# Patient Record
Sex: Female | Born: 1950 | Race: White | Hispanic: No | Smoking: Never smoker
Health system: Southern US, Community
[De-identification: ages and names within clinical notes are randomized; demographics above are authoritative.]

## PROBLEM LIST (undated history)

## (undated) DIAGNOSIS — E785 Hyperlipidemia, unspecified: Secondary | ICD-10-CM

## (undated) DIAGNOSIS — K219 Gastro-esophageal reflux disease without esophagitis: Secondary | ICD-10-CM

## (undated) DIAGNOSIS — I10 Essential (primary) hypertension: Secondary | ICD-10-CM

## (undated) DIAGNOSIS — F329 Major depressive disorder, single episode, unspecified: Secondary | ICD-10-CM

## (undated) DIAGNOSIS — F32A Depression, unspecified: Secondary | ICD-10-CM

## (undated) DIAGNOSIS — F419 Anxiety disorder, unspecified: Secondary | ICD-10-CM

## (undated) DIAGNOSIS — M797 Fibromyalgia: Secondary | ICD-10-CM

## (undated) DIAGNOSIS — G4733 Obstructive sleep apnea (adult) (pediatric): Secondary | ICD-10-CM

## (undated) HISTORY — PX: TRIGGER FINGER RELEASE: SHX641

## (undated) HISTORY — DX: Essential (primary) hypertension: I10

## (undated) HISTORY — PX: FRACTURE SURGERY: SHX138

## (undated) HISTORY — PX: REDUCTION MAMMAPLASTY: SUR839

## (undated) HISTORY — PX: CARPAL TUNNEL RELEASE: SHX101

## (undated) HISTORY — DX: Obstructive sleep apnea (adult) (pediatric): G47.33

## (undated) HISTORY — DX: Hyperlipidemia, unspecified: E78.5

## (undated) HISTORY — PX: ABDOMINAL HYSTERECTOMY: SHX81

---

## 1998-10-23 ENCOUNTER — Other Ambulatory Visit: Admission: RE | Admit: 1998-10-23 | Discharge: 1998-10-23 | Payer: Self-pay | Admitting: Obstetrics and Gynecology

## 1999-09-25 ENCOUNTER — Ambulatory Visit (HOSPITAL_BASED_OUTPATIENT_CLINIC_OR_DEPARTMENT_OTHER): Admission: RE | Admit: 1999-09-25 | Discharge: 1999-09-25 | Payer: Self-pay | Admitting: Orthopedic Surgery

## 1999-11-05 ENCOUNTER — Other Ambulatory Visit: Admission: RE | Admit: 1999-11-05 | Discharge: 1999-11-05 | Payer: Self-pay | Admitting: Obstetrics and Gynecology

## 1999-12-02 ENCOUNTER — Encounter: Payer: Self-pay | Admitting: Obstetrics and Gynecology

## 1999-12-02 ENCOUNTER — Encounter: Admission: RE | Admit: 1999-12-02 | Discharge: 1999-12-02 | Payer: Self-pay | Admitting: Obstetrics and Gynecology

## 2000-12-14 ENCOUNTER — Encounter: Payer: Self-pay | Admitting: Obstetrics and Gynecology

## 2000-12-14 ENCOUNTER — Encounter: Admission: RE | Admit: 2000-12-14 | Discharge: 2000-12-14 | Payer: Self-pay | Admitting: Obstetrics and Gynecology

## 2000-12-14 ENCOUNTER — Other Ambulatory Visit: Admission: RE | Admit: 2000-12-14 | Discharge: 2000-12-14 | Payer: Self-pay | Admitting: Obstetrics and Gynecology

## 2001-12-20 ENCOUNTER — Encounter: Admission: RE | Admit: 2001-12-20 | Discharge: 2001-12-20 | Payer: Self-pay | Admitting: Obstetrics and Gynecology

## 2001-12-20 ENCOUNTER — Encounter: Payer: Self-pay | Admitting: Obstetrics and Gynecology

## 2001-12-28 ENCOUNTER — Other Ambulatory Visit: Admission: RE | Admit: 2001-12-28 | Discharge: 2001-12-28 | Payer: Self-pay | Admitting: Obstetrics and Gynecology

## 2003-01-03 ENCOUNTER — Encounter: Admission: RE | Admit: 2003-01-03 | Discharge: 2003-01-03 | Payer: Self-pay | Admitting: Obstetrics and Gynecology

## 2003-01-03 ENCOUNTER — Encounter: Payer: Self-pay | Admitting: Obstetrics and Gynecology

## 2003-01-10 ENCOUNTER — Encounter: Admission: RE | Admit: 2003-01-10 | Discharge: 2003-01-10 | Payer: Self-pay | Admitting: Obstetrics and Gynecology

## 2003-01-10 ENCOUNTER — Encounter: Payer: Self-pay | Admitting: Obstetrics and Gynecology

## 2003-01-24 ENCOUNTER — Other Ambulatory Visit: Admission: RE | Admit: 2003-01-24 | Discharge: 2003-01-24 | Payer: Self-pay | Admitting: Obstetrics and Gynecology

## 2003-02-28 ENCOUNTER — Encounter: Admission: RE | Admit: 2003-02-28 | Discharge: 2003-02-28 | Payer: Self-pay

## 2004-02-05 ENCOUNTER — Encounter: Admission: RE | Admit: 2004-02-05 | Discharge: 2004-02-05 | Payer: Self-pay | Admitting: Obstetrics and Gynecology

## 2004-02-26 ENCOUNTER — Other Ambulatory Visit: Admission: RE | Admit: 2004-02-26 | Discharge: 2004-02-26 | Payer: Self-pay | Admitting: Obstetrics and Gynecology

## 2005-02-11 ENCOUNTER — Encounter: Admission: RE | Admit: 2005-02-11 | Discharge: 2005-02-11 | Payer: Self-pay | Admitting: Obstetrics and Gynecology

## 2006-03-17 ENCOUNTER — Encounter: Admission: RE | Admit: 2006-03-17 | Discharge: 2006-03-17 | Payer: Self-pay | Admitting: Obstetrics and Gynecology

## 2007-06-22 ENCOUNTER — Encounter: Admission: RE | Admit: 2007-06-22 | Discharge: 2007-06-22 | Payer: Self-pay | Admitting: Obstetrics and Gynecology

## 2008-11-10 ENCOUNTER — Encounter: Admission: RE | Admit: 2008-11-10 | Discharge: 2008-11-10 | Payer: Self-pay | Admitting: Obstetrics and Gynecology

## 2009-04-16 ENCOUNTER — Encounter: Admission: RE | Admit: 2009-04-16 | Discharge: 2009-04-16 | Payer: Self-pay | Admitting: Obstetrics and Gynecology

## 2009-10-10 ENCOUNTER — Ambulatory Visit (HOSPITAL_BASED_OUTPATIENT_CLINIC_OR_DEPARTMENT_OTHER): Admission: RE | Admit: 2009-10-10 | Discharge: 2009-10-10 | Payer: Self-pay | Admitting: Orthopedic Surgery

## 2009-10-10 ENCOUNTER — Encounter (INDEPENDENT_AMBULATORY_CARE_PROVIDER_SITE_OTHER): Payer: Self-pay | Admitting: Orthopedic Surgery

## 2010-07-16 ENCOUNTER — Ambulatory Visit (HOSPITAL_BASED_OUTPATIENT_CLINIC_OR_DEPARTMENT_OTHER): Admission: RE | Admit: 2010-07-16 | Discharge: 2010-07-16 | Payer: Self-pay | Admitting: Orthopedic Surgery

## 2011-02-28 LAB — POCT I-STAT, CHEM 8
BUN: 13 mg/dL (ref 6–23)
Calcium, Ion: 1.17 mmol/L (ref 1.12–1.32)
Chloride: 103 mEq/L (ref 96–112)
Creatinine, Ser: 0.9 mg/dL (ref 0.4–1.2)
Glucose, Bld: 111 mg/dL — ABNORMAL HIGH (ref 70–99)
HCT: 46 % (ref 36.0–46.0)
TCO2: 29 mmol/L (ref 0–100)

## 2011-03-20 LAB — BASIC METABOLIC PANEL
Calcium: 9.6 mg/dL (ref 8.4–10.5)
Chloride: 103 mEq/L (ref 96–112)
Creatinine, Ser: 0.78 mg/dL (ref 0.4–1.2)
GFR calc Af Amer: >60 mL/min (ref >60)
Potassium: 4 mEq/L (ref 3.5–5.1)
Sodium: 140 mEq/L (ref 135–145)

## 2011-09-24 ENCOUNTER — Encounter (HOSPITAL_COMMUNITY)
Admission: RE | Admit: 2011-09-24 | Discharge: 2011-09-24 | Disposition: A | Discharge status: Home / Self Care | Payer: 59 | Source: Ambulatory Visit | Attending: Orthopaedic Surgery | Admitting: Orthopaedic Surgery

## 2011-09-24 ENCOUNTER — Other Ambulatory Visit (HOSPITAL_COMMUNITY): Payer: Self-pay | Admitting: Orthopaedic Surgery

## 2011-09-24 DIAGNOSIS — M4802 Spinal stenosis, cervical region: Secondary | ICD-10-CM

## 2011-09-24 DIAGNOSIS — M4312 Spondylolisthesis, cervical region: Secondary | ICD-10-CM

## 2011-09-24 LAB — COMPREHENSIVE METABOLIC PANEL
Albumin: 3.8 g/dL (ref 3.5–5.2)
CO2: 30 mEq/L (ref 19–32)
Chloride: 104 mEq/L (ref 96–112)
Creatinine, Ser: 0.71 mg/dL (ref 0.50–1.10)
GFR calc non Af Amer: >90 mL/min (ref >90)
Total Bilirubin: 0.4 mg/dL (ref 0.3–1.2)
Total Protein: 6.6 g/dL (ref 6.0–8.3)

## 2011-09-24 LAB — CBC
HCT: 39.4 % (ref 36.0–46.0)
MCHC: 34.8 g/dL (ref 30.0–36.0)
RBC: 4.22 MIL/uL (ref 3.87–5.11)

## 2011-09-24 LAB — URINALYSIS, ROUTINE W REFLEX MICROSCOPIC
Glucose, UA: NEGATIVE mg/dL
Nitrite: NEGATIVE
Protein, ur: NEGATIVE mg/dL
Specific Gravity, Urine: 1.017 (ref 1.005–1.030)
Urobilinogen, UA: 0.2 mg/dL (ref 0.0–1.0)
pH: 5.5 (ref 5.0–8.0)

## 2011-09-24 LAB — DIFFERENTIAL
Basophils Relative: 1 % (ref 0–1)
Eosinophils Relative: 4 % (ref 0–5)
Lymphs Abs: 1.7 10*3/uL (ref 0.7–4.0)

## 2011-09-24 LAB — SURGICAL PCR SCREEN: Staphylococcus aureus: NEGATIVE

## 2011-09-24 LAB — PROTIME-INR: Prothrombin Time: 12.2 seconds (ref 11.6–15.2)

## 2011-10-03 ENCOUNTER — Inpatient Hospital Stay (HOSPITAL_COMMUNITY)
Admission: RE | Admit: 2011-10-03 | Discharge: 2011-10-04 | DRG: 473 | Disposition: A | Discharge status: Home / Self Care | Payer: 59 | Source: Ambulatory Visit | Attending: Orthopaedic Surgery | Admitting: Orthopaedic Surgery

## 2011-10-03 ENCOUNTER — Other Ambulatory Visit (HOSPITAL_COMMUNITY): Payer: Self-pay | Admitting: Orthopaedic Surgery

## 2011-10-03 ENCOUNTER — Inpatient Hospital Stay (HOSPITAL_COMMUNITY): Payer: 59

## 2011-10-03 ENCOUNTER — Ambulatory Visit (HOSPITAL_COMMUNITY)
Admission: RE | Admit: 2011-10-03 | Discharge: 2011-10-03 | Disposition: A | Discharge status: Home / Self Care | Payer: 59 | Source: Ambulatory Visit | Attending: Orthopaedic Surgery | Admitting: Orthopaedic Surgery

## 2011-10-03 DIAGNOSIS — M4322 Fusion of spine, cervical region: Secondary | ICD-10-CM

## 2011-10-03 DIAGNOSIS — K219 Gastro-esophageal reflux disease without esophagitis: Secondary | ICD-10-CM | POA: Diagnosis present

## 2011-10-03 DIAGNOSIS — H269 Unspecified cataract: Secondary | ICD-10-CM | POA: Diagnosis present

## 2011-10-03 DIAGNOSIS — M47812 Spondylosis without myelopathy or radiculopathy, cervical region: Principal | ICD-10-CM | POA: Diagnosis present

## 2011-10-03 DIAGNOSIS — I10 Essential (primary) hypertension: Secondary | ICD-10-CM | POA: Diagnosis present

## 2011-10-03 DIAGNOSIS — Z01818 Encounter for other preprocedural examination: Secondary | ICD-10-CM

## 2011-10-03 DIAGNOSIS — G4730 Sleep apnea, unspecified: Secondary | ICD-10-CM | POA: Diagnosis present

## 2011-10-03 DIAGNOSIS — Z23 Encounter for immunization: Secondary | ICD-10-CM

## 2011-10-03 DIAGNOSIS — Z01810 Encounter for preprocedural cardiovascular examination: Secondary | ICD-10-CM

## 2011-10-03 DIAGNOSIS — F3289 Other specified depressive episodes: Secondary | ICD-10-CM | POA: Diagnosis present

## 2011-10-03 DIAGNOSIS — Z01812 Encounter for preprocedural laboratory examination: Secondary | ICD-10-CM

## 2011-10-03 DIAGNOSIS — Z880 Allergy status to penicillin: Secondary | ICD-10-CM

## 2011-10-03 DIAGNOSIS — Q762 Congenital spondylolisthesis: Secondary | ICD-10-CM

## 2011-10-03 DIAGNOSIS — F329 Major depressive disorder, single episode, unspecified: Secondary | ICD-10-CM | POA: Diagnosis present

## 2011-10-03 DIAGNOSIS — M129 Arthropathy, unspecified: Secondary | ICD-10-CM | POA: Diagnosis present

## 2011-10-03 DIAGNOSIS — F411 Generalized anxiety disorder: Secondary | ICD-10-CM | POA: Diagnosis present

## 2011-10-11 NOTE — Op Note (Signed)
NAMEELLERY, TASH NO.:  [id number]  MEDICAL RECORD NO.:  [id number]  LOCATION:  5018                         FACILITY:  [facility]  PHYSICIAN:  Amer Alcindor C. Ophelia Charter, M.D.    DATE OF BIRTH:  1951-04-09  DATE OF PROCEDURE:  10/03/2011 DATE OF DISCHARGE:                              OPERATIVE REPORT   PREOPERATIVE DIAGNOSIS:  Cervical spondylosis C4-5, C5-6.  POSTOPERATIVE DIAGNOSIS:  Cervical spondylosis C4-5, C5-6.  PROCEDURE:  C4-5, C5-6 anterior cervical diskectomy and fusion, allograft and plate.  SURGEON:  Sacheen Arrasmith C. Ophelia Charter, MD  ASSISTANT:  Wende Neighbors, PA-C, medically necessary and present for the entire procedure.  ESTIMATED BLOOD LOSS:  Minimal.  DRAINS:  1 Hemovac neck.  After induction of general anesthesia orotracheal intubation with head halter traction application with 2-pound weight, a horseshoe head holder had been wrapped with Webril.  Arms were tucked at the side with padding over the ulnar nerve.  DuraPrep was used.  Preoperative Ancef was given. Time-out procedure was completed.  Sterile skin marker was used on the neck.  Area was cleared with towels, Betadine, Steri-Drape in sterile Mayo stand at the head and thyroid sheets and drapes.  Incision was made starting at the midline extending to the left midway between the C4-5, C5-6 level based on palpable landmarks.  Platysma was thick and was divided in line with skin incision, blunt dissection down above the omohyoid was performed down to the longus colli muscle.  Spurs were palpated corresponding with x-rays and MRI scan which were displayed in the room.  Short 25 needle was placed in the C5-6 disk space confirmed with lateral C-arm spot photo.  It was marked after moving the knee with direct visualization cutting ulna disk removing some with pituitary and then self-retaining retractors were placed right and left, smooth blades up and down.  Using the operative microscope, bur was used to  remove the spurs.  Endplates were hand curetted, progressing gradually back to the posterior longitudinal ligament.  Uncovertebral spurs were removed. Chunks of ligament were removed.  Once there was complete take down of posterior and longitudinal ligament, dura was well visualized and overhanging spurs were removed that were so causing the moderate stenosis.  Right and left uncovertebral joints were stripped and foramina was enlarged using the 2-mm Kerrison in each gutter right and left.  Endplate was rasped, size 7-mm graft was placed, countersunk slightly, there were considerably anterior spurs.  These were trimmed back and on the midportion of the C6 vertebrae bur had to be used to flatten the bone slightly so that the plate would be able to sit flat.  At the end of procedure repeated at L4-5.  There was a little bit of epidural bleeding on the left L4-5, some thrombin and Gelfoam was placed.  Dura was decompressed.  Spurs were removed.  Trial sizers showed a 7 mm, gave a good fit.  Graft was placed.  Anterior spurs removed.  A 30-mm VueLock Biomet EBI plate was selected, inserted and 14 mm screws were placed on the top and the bottom of the plate.  In the middle of the plate in the C4 level, a 16-mm screw  was placed on the right side.  On the left the screw wanted to angle down was left out and was attempted to be redirected but wanted to angle wrong position. Graft was extremely tight and the 16 was selected since with removal of the spurs.  At the level of the vertebral body there was 1 or 2 mm gap and the plate was not flushed with anterior cortex.  AP and lateral x- rays were taken which showed good position alignment of the graft and screws and after irrigation, Hemovac was placed through separate stab incision in and out technique.  Platysma was closed with 3-0, 4-0 Vicryl subcuticular closure, tincture of benzoin and Steri-Strips, Marcaine infiltration, postop dressing and  soft cervical collar.  The patient tolerated the procedure well in stable condition.     Mikaelyn Arthurs C. Ophelia Charter, M.D.     MCY/MEDQ  D:  10/03/2011  T:  10/04/2011  Job:  [id number]  Electronically Signed by Annell Greening M.D. on 10/11/2011 12:27:26 PM

## 2012-10-12 IMAGING — CR DG CERVICAL SPINE 1V
1 series · 1 of 1 positions shown · non-contrast
Comparison: None.

CLINICAL DATA: Preoperative examination (C4 - C5, C5 - C6 ACDF)

DG SPINE PORTABLE - 1 VIEW

[view not recorded]
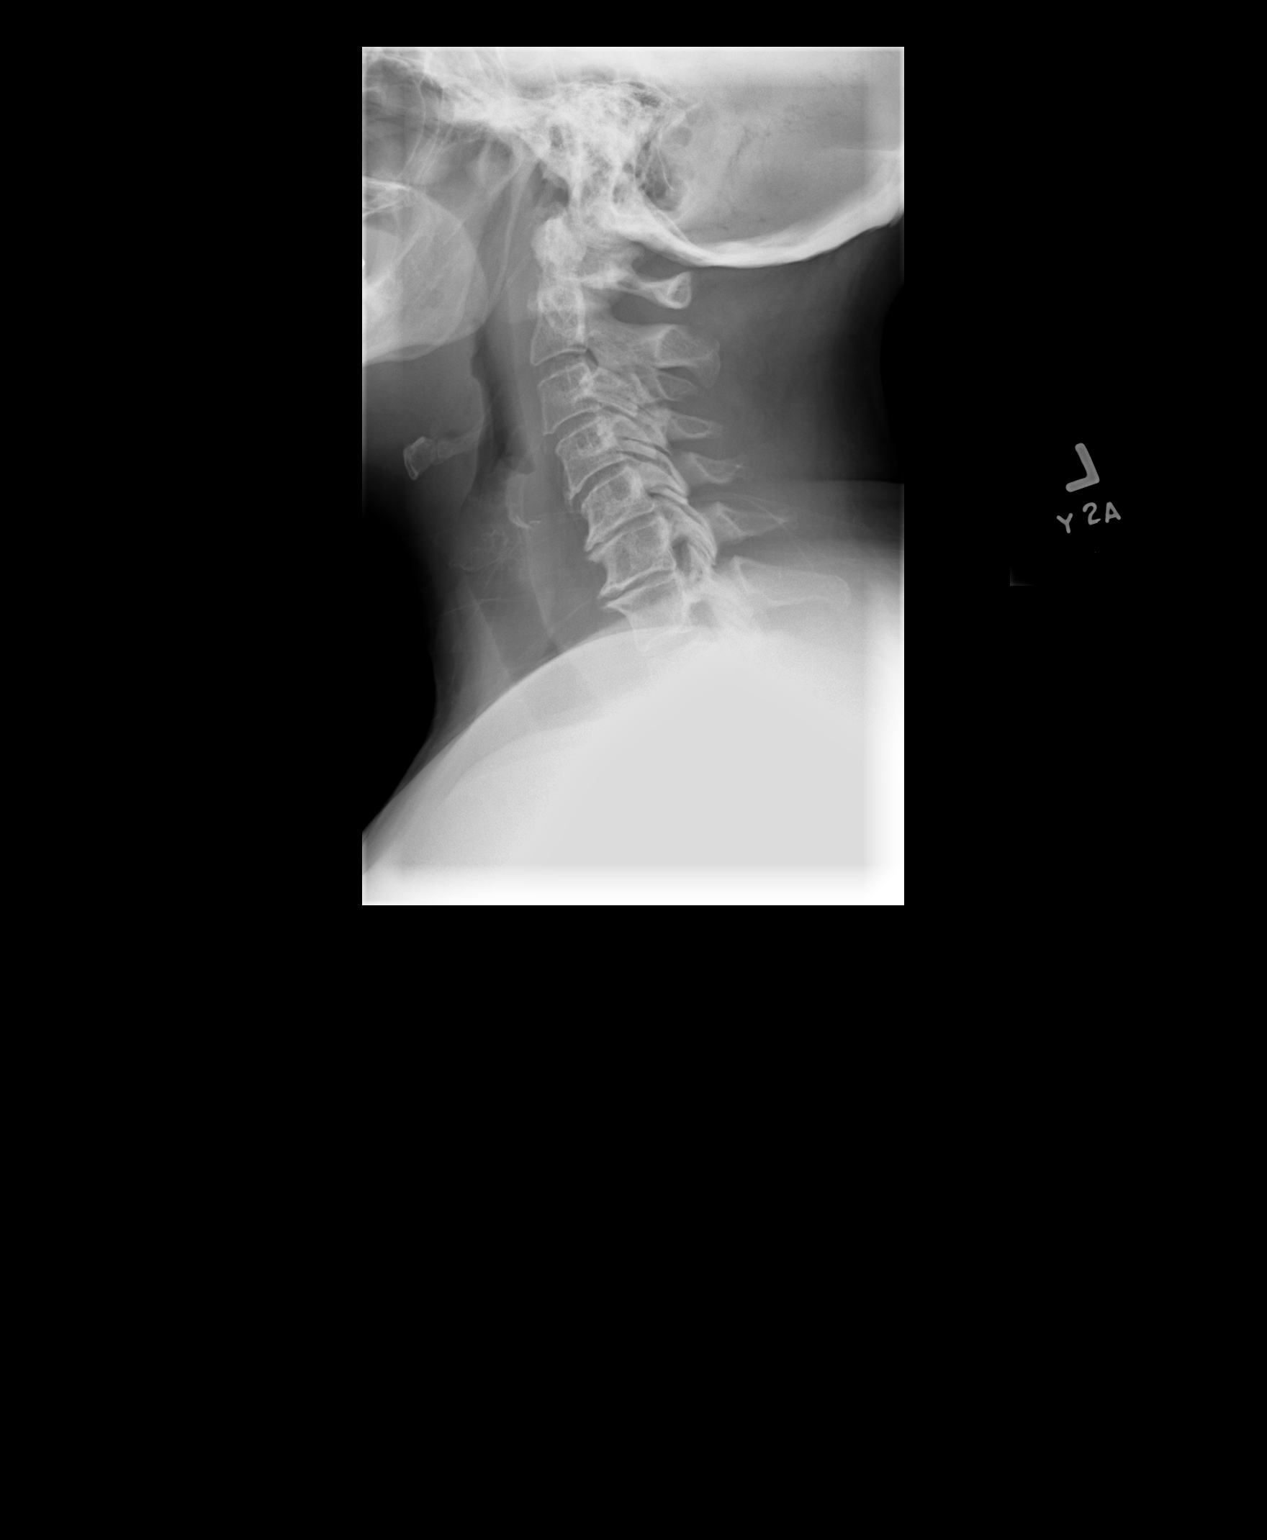

[1 of 1 positions shown; findings below may reference images not displayed]

FINDINGS: C1 to the superior endplate of T1 is visualized on the
lateral radiograph.  Normal alignment of the cervical spine.  No
anterolisthesis or retrolisthesis. Vertebral body heights are
preserved.  Prevertebral soft tissues are normal.  Multilevel DDD,
most conspicuous at C5 - C6 and C6 - C7 with disc space height
loss, end plate irregularity and primarily anteriorly directed
osteophytosis.
IMPRESSION: Multilevel DDD, most severe at C5 - C6 and C6 - C7.

## 2012-10-12 IMAGING — RF DG CERVICAL SPINE 2 OR 3 VIEWS
1 series · 2 of 2 positions shown · non-contrast
Comparison: Prior study 10/03/2011.

CLINICAL DATA: Cervical fusion.

CERVICAL SPINE - 2-3 VIEW

[Series 1: run · 2 of 2 slices shown]
[im 1/2]
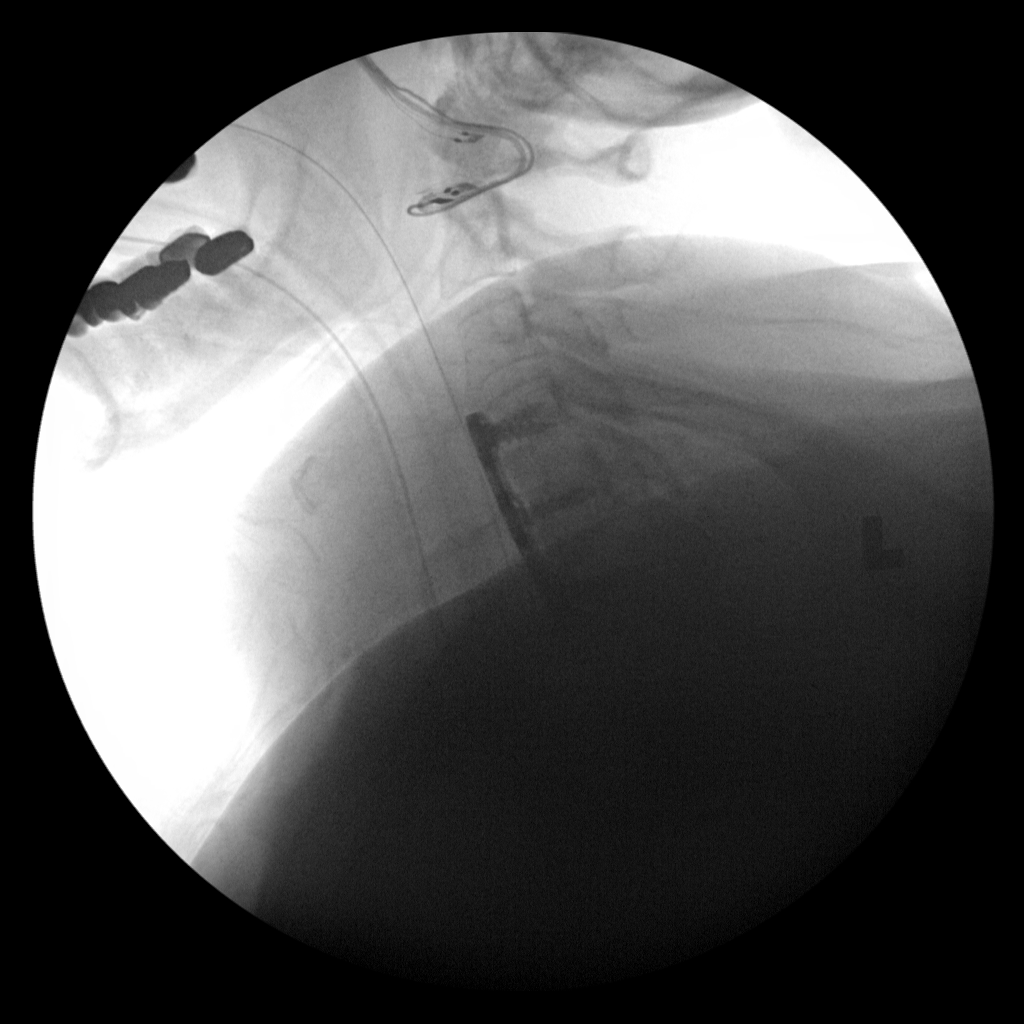
[im 2/2]
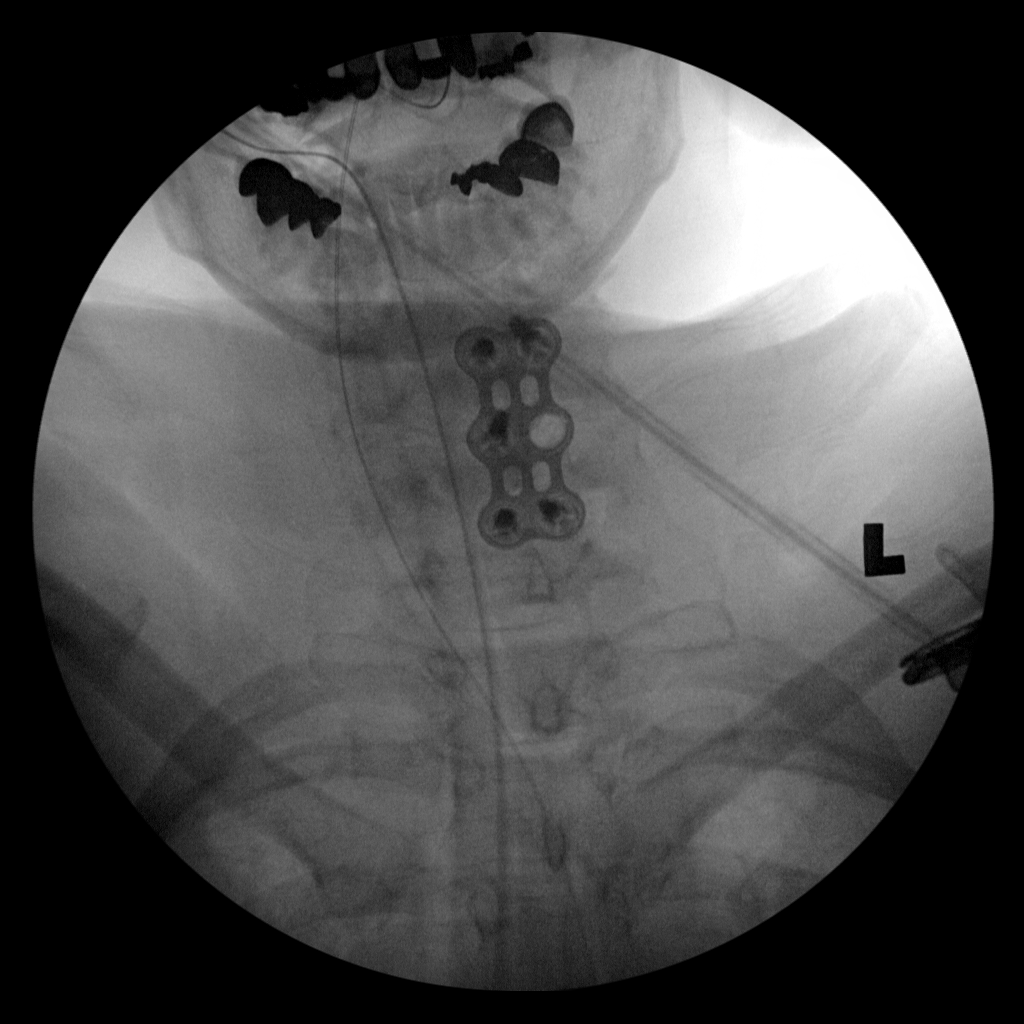

[2 of 2 positions shown; findings below may reference images not displayed]

FINDINGS: There are anterior plate and screws and interbody bone
plugs fusing C4-5 and C5-6.  No complicating features are
demonstrated.
IMPRESSION: C4-5 and C5-6 fusion.

## 2013-11-14 ENCOUNTER — Other Ambulatory Visit: Payer: Self-pay | Admitting: Orthopaedic Surgery

## 2013-11-14 DIAGNOSIS — M545 Low back pain: Secondary | ICD-10-CM

## 2013-11-22 ENCOUNTER — Encounter: Payer: Self-pay | Admitting: Interventional Cardiology

## 2013-11-22 ENCOUNTER — Encounter: Payer: Self-pay | Admitting: Cardiology

## 2013-11-22 ENCOUNTER — Ambulatory Visit (INDEPENDENT_AMBULATORY_CARE_PROVIDER_SITE_OTHER): Payer: 59 | Admitting: Interventional Cardiology

## 2013-11-22 VITALS — BP 132/78 | HR 81 | Ht 59.0 in | Wt 180.0 lb

## 2013-11-22 DIAGNOSIS — I10 Essential (primary) hypertension: Secondary | ICD-10-CM

## 2013-11-22 DIAGNOSIS — R0602 Shortness of breath: Secondary | ICD-10-CM

## 2013-11-22 DIAGNOSIS — R079 Chest pain, unspecified: Secondary | ICD-10-CM

## 2013-11-22 DIAGNOSIS — E669 Obesity, unspecified: Secondary | ICD-10-CM

## 2013-11-22 NOTE — Progress Notes (Signed)
Patient ID: Catherine Morgan, female   DOB: 07-05-1951, 62 y.o.   MRN: [id number]     Patient ID: Catherine Morgan MRN: [id number] DOB/AGE: [birth year] 62 y.o.   Referring Physician r. McComb   Reason for Consultation shortness of breath  HPI: 62 y/o who had been feeling well other than chronic leg pain from fibromyalgia.  She has had fatigue for a while.  She has not been exercising much for the past year.  She reports some SHOB for the past month.  Walking a short distance can cause SHOB.  It is worse with anxiety.  Wore with bending down.  She had trouble getting dressed in the mornings.  She has had some swelling in her hands.  Not much in the ankles.    BP has not been checked outside of the MDs office. Normally good at the MDs office.    She has had some chest pessure radiating to the left arm at times.  She stands a lot at work.  She does walk up stairs sometimes.  That causes some SHOB.   No P with walking up the stairs.   Dizziness with standing up.  No syncope.    Current Outpatient Prescriptions  Medication Sig Dispense Refill  . Calcium Citrate-Vitamin D (CITRACAL + D PO) Take by mouth.      . DULoxetine (CYMBALTA) 60 MG capsule Take 60 mg by mouth daily.       Marland Kitchen ELESTRIN 0.52 MG/0.87 GM (0.06%) GEL 1 application daily.       . fexofenadine (ALLEGRA) 180 MG tablet Take 180 mg by mouth daily.      Marland Kitchen gabapentin (NEURONTIN) 600 MG tablet Take 600 mg by mouth 2 (two) times daily.       . Glucosamine-Chondroitin (GLUCOSAMINE CHONDR COMPLEX PO) Take by mouth.      Marland Kitchen lisinopril-hydrochlorothiazide (PRINZIDE,ZESTORETIC) 10-12.5 MG per tablet Take by mouth daily.       . metoprolol succinate (TOPROL-XL) 50 MG 24 hr tablet Take 50 mg by mouth daily.       Marland Kitchen MICRONIZED COLESTIPOL HCL 1 G tablet Take 1 g by mouth daily.       . Multiple Vitamins-Minerals (MULTIVITAMIN PO) Take by mouth.      . naproxen (NAPROSYN) 500 MG tablet Take 500 mg by mouth 2 (two) times daily with a meal.       .  NEXIUM 40 MG capsule Take 40 mg by mouth daily at 12 noon.       . Omega-3 Krill Oil 1000 MG CAPS Take by mouth.      Lilian Kapur 625 MG tablet Take 625 mg by mouth 2 (two) times daily with a meal.        No current facility-administered medications for this visit.   No past medical history on file.  No family history on file.  History   Social History  . Marital Status: Widowed    Spouse Name: N/A    Number of Children: N/A  . Years of Education: N/A   Occupational History  . Not on file.   Social History Main Topics  . Smoking status: Never Smoker   . Smokeless tobacco: Not on file  . Alcohol Use: Not on file  . Drug Use: Not on file  . Sexual Activity: Not on file   Other Topics Concern  . Not on file   Social History Narrative  . No narrative on file    No past surgical  history on file.    (Not in a hospital admission)  Review of systems complete and found to be negative unless listed above .  No nausea, vomiting.  No fever chills, No focal weakness,  No palpitations.  Physical Exam: Filed Vitals:   11/22/13 1322  BP: 132/78  Pulse: 81    Weight: 180 lb (81.647 kg)  Physical exam:  Cherry/AT EOMI No JVD, No carotid bruit RRR S1S2  No wheezing Soft. NT, nondistended No edema. No focal motor or sensory deficits Normal affect  Labs:   Lab Results  Component Value Date   WBC 4.6 09/24/2011   HGB 13.7 09/24/2011   HCT 39.4 09/24/2011   MCV 93.4 09/24/2011   PLT 234 09/24/2011   No results found for this basename: NA, K, CL, CO2, BUN, CREATININE, CALCIUM, LABALBU, PROT, BILITOT, ALKPHOS, ALT, AST, GLUCOSE,  in the last 168 hours No results found for this basename: CKTOTAL,  CKMB,  CKMBINDEX,  TROPONINI    No results found for this basename: CHOL   No results found for this basename: HDL   No results found for this basename: LDLCALC   No results found for this basename: TRIG   No results found for this basename: CHOLHDL   No results found for this  basename: LDLDIRECT       EKG: Normal sinus rhythm, incomplete right bundle branch block, nonspecific ST segment changes  ASSESSMENT AND PLAN:  1) chest discomfort: Several atypical features. Will have her perform an exercise treadmill test to evaluate for ischemia.  2) shortness of breath: Check echocardiogram to evaluate for structural heart disease. No overt signs of fluid overload. No lower extremity swelling. She could have some diastolic heart failure. I think part of this could be from deconditioning. She has not been very active for quite a long time due to her leg pains from fibromyalgia.  3) obesity: she would benefit from weight loss.    4) dizziness: I think is likely a side effect of her medications. I stressed the importance of staying well hydrated. Signed:   Fredric Mare, MD, [facility] 11/22/2013, 2:22 PM

## 2013-11-22 NOTE — Patient Instructions (Signed)
Your physician has requested that you have an echocardiogram. Echocardiography is a painless test that uses sound waves to create images of your heart. It provides your doctor with information about the size and shape of your heart and how well your heart's chambers and valves are working. This procedure takes approximately one hour. There are no restrictions for this procedure.  Your physician has requested that you have an exercise tolerance test. For further information please visit [email or web address]. Please also follow instruction sheet, as given.   

## 2013-11-23 ENCOUNTER — Ambulatory Visit
Admission: RE | Admit: 2013-11-23 | Discharge: 2013-11-23 | Disposition: A | Discharge status: Home / Self Care | Payer: PRIVATE HEALTH INSURANCE | Source: Ambulatory Visit | Attending: Orthopaedic Surgery | Admitting: Orthopaedic Surgery

## 2013-11-23 DIAGNOSIS — M545 Low back pain: Secondary | ICD-10-CM

## 2013-12-19 ENCOUNTER — Ambulatory Visit (HOSPITAL_COMMUNITY): Payer: 59 | Attending: Interventional Cardiology | Admitting: Cardiology

## 2013-12-19 ENCOUNTER — Encounter: Payer: Self-pay | Admitting: Cardiovascular Disease

## 2013-12-19 DIAGNOSIS — R0609 Other forms of dyspnea: Secondary | ICD-10-CM | POA: Insufficient documentation

## 2013-12-19 DIAGNOSIS — R0989 Other specified symptoms and signs involving the circulatory and respiratory systems: Secondary | ICD-10-CM | POA: Insufficient documentation

## 2013-12-19 DIAGNOSIS — I079 Rheumatic tricuspid valve disease, unspecified: Secondary | ICD-10-CM | POA: Insufficient documentation

## 2013-12-19 DIAGNOSIS — R0602 Shortness of breath: Secondary | ICD-10-CM

## 2013-12-19 DIAGNOSIS — R072 Precordial pain: Secondary | ICD-10-CM

## 2013-12-19 DIAGNOSIS — E669 Obesity, unspecified: Secondary | ICD-10-CM | POA: Insufficient documentation

## 2013-12-19 DIAGNOSIS — R079 Chest pain, unspecified: Secondary | ICD-10-CM | POA: Insufficient documentation

## 2013-12-19 DIAGNOSIS — Z6836 Body mass index (BMI) 36.0-36.9, adult: Secondary | ICD-10-CM | POA: Insufficient documentation

## 2013-12-19 DIAGNOSIS — I10 Essential (primary) hypertension: Secondary | ICD-10-CM | POA: Insufficient documentation

## 2013-12-19 NOTE — Progress Notes (Signed)
Echo performed. 

## 2013-12-27 ENCOUNTER — Encounter: Payer: 59 | Admitting: Nurse Practitioner

## 2015-06-06 ENCOUNTER — Other Ambulatory Visit: Payer: Self-pay | Admitting: Orthopedic Surgery

## 2015-06-22 ENCOUNTER — Encounter (HOSPITAL_BASED_OUTPATIENT_CLINIC_OR_DEPARTMENT_OTHER): Payer: Self-pay | Admitting: *Deleted

## 2015-06-25 ENCOUNTER — Encounter (HOSPITAL_BASED_OUTPATIENT_CLINIC_OR_DEPARTMENT_OTHER)
Admission: RE | Admit: 2015-06-25 | Discharge: 2015-06-25 | Disposition: A | Discharge status: Home / Self Care | Payer: Medicare Other | Source: Ambulatory Visit | Attending: Orthopedic Surgery | Admitting: Orthopedic Surgery

## 2015-06-25 DIAGNOSIS — Z6832 Body mass index (BMI) 32.0-32.9, adult: Secondary | ICD-10-CM | POA: Diagnosis not present

## 2015-06-25 DIAGNOSIS — Z79899 Other long term (current) drug therapy: Secondary | ICD-10-CM | POA: Diagnosis not present

## 2015-06-25 DIAGNOSIS — Z885 Allergy status to narcotic agent status: Secondary | ICD-10-CM | POA: Diagnosis not present

## 2015-06-25 DIAGNOSIS — Z880 Allergy status to penicillin: Secondary | ICD-10-CM | POA: Diagnosis not present

## 2015-06-25 DIAGNOSIS — R2231 Localized swelling, mass and lump, right upper limb: Secondary | ICD-10-CM | POA: Diagnosis present

## 2015-06-25 DIAGNOSIS — E785 Hyperlipidemia, unspecified: Secondary | ICD-10-CM | POA: Diagnosis not present

## 2015-06-25 DIAGNOSIS — G4733 Obstructive sleep apnea (adult) (pediatric): Secondary | ICD-10-CM | POA: Diagnosis not present

## 2015-06-25 DIAGNOSIS — F329 Major depressive disorder, single episode, unspecified: Secondary | ICD-10-CM | POA: Diagnosis not present

## 2015-06-25 DIAGNOSIS — M797 Fibromyalgia: Secondary | ICD-10-CM | POA: Diagnosis not present

## 2015-06-25 DIAGNOSIS — F419 Anxiety disorder, unspecified: Secondary | ICD-10-CM | POA: Diagnosis not present

## 2015-06-25 DIAGNOSIS — I10 Essential (primary) hypertension: Secondary | ICD-10-CM | POA: Diagnosis not present

## 2015-06-25 DIAGNOSIS — K219 Gastro-esophageal reflux disease without esophagitis: Secondary | ICD-10-CM | POA: Diagnosis not present

## 2015-06-25 LAB — BASIC METABOLIC PANEL
ANION GAP: 6 (ref 5–15)
BUN: 14 mg/dL (ref 6–20)
CO2: 28 mmol/L (ref 22–32)
CREATININE: 0.76 mg/dL (ref 0.44–1.00)
Calcium: 9.4 mg/dL (ref 8.9–10.3)
Chloride: 109 mmol/L (ref 101–111)
GFR calc Af Amer: >60 mL/min (ref >60)
Glucose, Bld: 97 mg/dL (ref 65–99)
POTASSIUM: 3.9 mmol/L (ref 3.5–5.1)
Sodium: 143 mmol/L (ref 135–145)

## 2015-06-26 ENCOUNTER — Ambulatory Visit (HOSPITAL_BASED_OUTPATIENT_CLINIC_OR_DEPARTMENT_OTHER): Payer: Medicare Other | Admitting: Anesthesiology

## 2015-06-26 ENCOUNTER — Encounter (HOSPITAL_BASED_OUTPATIENT_CLINIC_OR_DEPARTMENT_OTHER)
Admission: RE | Disposition: A | Discharge status: Home / Self Care | Payer: Self-pay | Source: Ambulatory Visit | Attending: Orthopedic Surgery

## 2015-06-26 ENCOUNTER — Encounter (HOSPITAL_BASED_OUTPATIENT_CLINIC_OR_DEPARTMENT_OTHER): Payer: Self-pay

## 2015-06-26 ENCOUNTER — Ambulatory Visit (HOSPITAL_BASED_OUTPATIENT_CLINIC_OR_DEPARTMENT_OTHER)
Admission: RE | Admit: 2015-06-26 | Discharge: 2015-06-26 | Disposition: A | Discharge status: Home / Self Care | Payer: Medicare Other | Source: Ambulatory Visit | Attending: Orthopedic Surgery | Admitting: Orthopedic Surgery

## 2015-06-26 DIAGNOSIS — K219 Gastro-esophageal reflux disease without esophagitis: Secondary | ICD-10-CM | POA: Insufficient documentation

## 2015-06-26 DIAGNOSIS — E785 Hyperlipidemia, unspecified: Secondary | ICD-10-CM | POA: Diagnosis not present

## 2015-06-26 DIAGNOSIS — G4733 Obstructive sleep apnea (adult) (pediatric): Secondary | ICD-10-CM | POA: Insufficient documentation

## 2015-06-26 DIAGNOSIS — Z885 Allergy status to narcotic agent status: Secondary | ICD-10-CM | POA: Insufficient documentation

## 2015-06-26 DIAGNOSIS — F419 Anxiety disorder, unspecified: Secondary | ICD-10-CM | POA: Insufficient documentation

## 2015-06-26 DIAGNOSIS — F329 Major depressive disorder, single episode, unspecified: Secondary | ICD-10-CM | POA: Insufficient documentation

## 2015-06-26 DIAGNOSIS — Z880 Allergy status to penicillin: Secondary | ICD-10-CM | POA: Insufficient documentation

## 2015-06-26 DIAGNOSIS — I10 Essential (primary) hypertension: Secondary | ICD-10-CM | POA: Insufficient documentation

## 2015-06-26 DIAGNOSIS — Z79899 Other long term (current) drug therapy: Secondary | ICD-10-CM | POA: Insufficient documentation

## 2015-06-26 DIAGNOSIS — R2231 Localized swelling, mass and lump, right upper limb: Secondary | ICD-10-CM | POA: Diagnosis not present

## 2015-06-26 DIAGNOSIS — Z6832 Body mass index (BMI) 32.0-32.9, adult: Secondary | ICD-10-CM | POA: Insufficient documentation

## 2015-06-26 DIAGNOSIS — M797 Fibromyalgia: Secondary | ICD-10-CM | POA: Insufficient documentation

## 2015-06-26 HISTORY — DX: Fibromyalgia: M79.7

## 2015-06-26 HISTORY — PX: MASS EXCISION: SHX2000

## 2015-06-26 HISTORY — DX: Depression, unspecified: F32.A

## 2015-06-26 HISTORY — DX: Anxiety disorder, unspecified: F41.9

## 2015-06-26 HISTORY — DX: Gastro-esophageal reflux disease without esophagitis: K21.9

## 2015-06-26 HISTORY — DX: Major depressive disorder, single episode, unspecified: F32.9

## 2015-06-26 LAB — POCT HEMOGLOBIN-HEMACUE: HEMOGLOBIN: 13.5 g/dL (ref 12.0–15.0)

## 2015-06-26 SURGERY — EXCISION MASS
Anesthesia: General | Site: Finger | Laterality: Right

## 2015-06-26 MED ORDER — VANCOMYCIN HCL IN DEXTROSE 1-5 GM/200ML-% IV SOLN
1000.0000 mg | INTRAVENOUS | Status: AC
  Administered 2015-06-26: 1000 mg via INTRAVENOUS

## 2015-06-26 MED ORDER — FENTANYL CITRATE (PF) 100 MCG/2ML IJ SOLN
INTRAMUSCULAR | Status: AC
  Filled 2015-06-26: qty 4

## 2015-06-26 MED ORDER — VANCOMYCIN HCL 10 G IV SOLR: 1500.0000 mg | INTRAVENOUS | Status: DC

## 2015-06-26 MED ORDER — SCOPOLAMINE 1 MG/3DAYS TD PT72: 1.0000 | MEDICATED_PATCH | Freq: Once | TRANSDERMAL | Status: DC | PRN

## 2015-06-26 MED ORDER — MEPERIDINE HCL 25 MG/ML IJ SOLN: 6.2500 mg | INTRAMUSCULAR | Status: DC | PRN

## 2015-06-26 MED ORDER — VANCOMYCIN HCL IN DEXTROSE 500-5 MG/100ML-% IV SOLN
INTRAVENOUS | Status: AC
  Filled 2015-06-26: qty 100

## 2015-06-26 MED ORDER — CHLORHEXIDINE GLUCONATE 4 % EX LIQD: 60.0000 mL | Freq: Once | CUTANEOUS | Status: DC

## 2015-06-26 MED ORDER — FENTANYL CITRATE (PF) 100 MCG/2ML IJ SOLN
25.0000 ug | INTRAMUSCULAR | Status: DC | PRN
  Administered 2015-06-26: 25 ug via INTRAVENOUS

## 2015-06-26 MED ORDER — FENTANYL CITRATE (PF) 100 MCG/2ML IJ SOLN
50.0000 ug | INTRAMUSCULAR | Status: DC | PRN
  Administered 2015-06-26: 50 ug via INTRAVENOUS

## 2015-06-26 MED ORDER — MIDAZOLAM HCL 2 MG/2ML IJ SOLN
INTRAMUSCULAR | Status: AC
  Filled 2015-06-26: qty 2

## 2015-06-26 MED ORDER — ONDANSETRON HCL 4 MG/2ML IJ SOLN
INTRAMUSCULAR | Status: DC | PRN
  Administered 2015-06-26: 4 mg via INTRAVENOUS

## 2015-06-26 MED ORDER — FENTANYL CITRATE (PF) 100 MCG/2ML IJ SOLN
INTRAMUSCULAR | Status: AC
  Filled 2015-06-26: qty 2

## 2015-06-26 MED ORDER — PROPOFOL 500 MG/50ML IV EMUL
INTRAVENOUS | Status: AC
  Filled 2015-06-26: qty 50

## 2015-06-26 MED ORDER — VANCOMYCIN HCL IN DEXTROSE 1-5 GM/200ML-% IV SOLN
INTRAVENOUS | Status: AC
  Filled 2015-06-26: qty 200

## 2015-06-26 MED ORDER — LIDOCAINE HCL (CARDIAC) 20 MG/ML IV SOLN
INTRAVENOUS | Status: DC | PRN
  Administered 2015-06-26: 50 mg via INTRAVENOUS

## 2015-06-26 MED ORDER — BUPIVACAINE HCL (PF) 0.25 % IJ SOLN
INTRAMUSCULAR | Status: DC | PRN
  Administered 2015-06-26: 5 mL

## 2015-06-26 MED ORDER — MIDAZOLAM HCL 2 MG/2ML IJ SOLN
1.0000 mg | INTRAMUSCULAR | Status: DC | PRN
  Administered 2015-06-26: 1 mg via INTRAVENOUS

## 2015-06-26 MED ORDER — LACTATED RINGERS IV SOLN
INTRAVENOUS | Status: DC
  Administered 2015-06-26: 09:00:00 via INTRAVENOUS

## 2015-06-26 MED ORDER — GLYCOPYRROLATE 0.2 MG/ML IJ SOLN: 0.2000 mg | Freq: Once | INTRAMUSCULAR | Status: DC | PRN

## 2015-06-26 MED ORDER — PROPOFOL 10 MG/ML IV BOLUS
INTRAVENOUS | Status: DC | PRN
  Administered 2015-06-26: 200 mg via INTRAVENOUS

## 2015-06-26 MED ORDER — DEXAMETHASONE SODIUM PHOSPHATE 4 MG/ML IJ SOLN
INTRAMUSCULAR | Status: DC | PRN
  Administered 2015-06-26: 10 mg via INTRAVENOUS

## 2015-06-26 MED ORDER — HYDROCODONE-ACETAMINOPHEN 5-325 MG PO TABS: 1.0000 | ORAL_TABLET | Freq: Four times a day (QID) | ORAL | Status: DC | PRN

## 2015-06-26 SURGICAL SUPPLY — 47 items
BANDAGE COBAN STERILE 2 (GAUZE/BANDAGES/DRESSINGS) ×2 IMPLANT
BLADE SURG 15 STRL LF DISP TIS (BLADE) ×1 IMPLANT
BLADE SURG 15 STRL SS (BLADE) ×3
BNDG CMPR 9X4 STRL LF SNTH (GAUZE/BANDAGES/DRESSINGS) ×1
BNDG COHESIVE 1X5 TAN STRL LF (GAUZE/BANDAGES/DRESSINGS) IMPLANT
BNDG COHESIVE 3X5 TAN STRL LF (GAUZE/BANDAGES/DRESSINGS) IMPLANT
BNDG ESMARK 4X9 LF (GAUZE/BANDAGES/DRESSINGS) ×2 IMPLANT
BNDG GAUZE ELAST 4 BULKY (GAUZE/BANDAGES/DRESSINGS) IMPLANT
CHLORAPREP W/TINT 26ML (MISCELLANEOUS) ×3 IMPLANT
CORDS BIPOLAR (ELECTRODE) ×3 IMPLANT
COVER BACK TABLE 60X90IN (DRAPES) ×3 IMPLANT
COVER MAYO STAND STRL (DRAPES) ×3 IMPLANT
CUFF TOURNIQUET SINGLE 18IN (TOURNIQUET CUFF) ×2 IMPLANT
DECANTER SPIKE VIAL GLASS SM (MISCELLANEOUS) IMPLANT
DRAIN PENROSE 1/2X12 LTX STRL (WOUND CARE) IMPLANT
DRAPE EXTREMITY T 121X128X90 (DRAPE) ×3 IMPLANT
DRAPE SURG 17X23 STRL (DRAPES) ×3 IMPLANT
GAUZE SPONGE 4X4 12PLY STRL (GAUZE/BANDAGES/DRESSINGS) ×3 IMPLANT
GAUZE XEROFORM 1X8 LF (GAUZE/BANDAGES/DRESSINGS) ×3 IMPLANT
GLOVE BIO SURGEON STRL SZ 6.5 (GLOVE) ×1 IMPLANT
GLOVE BIO SURGEONS STRL SZ 6.5 (GLOVE) ×1
GLOVE BIOGEL PI IND STRL 7.0 (GLOVE) IMPLANT
GLOVE BIOGEL PI IND STRL 8.5 (GLOVE) ×1 IMPLANT
GLOVE BIOGEL PI INDICATOR 7.0 (GLOVE) ×4
GLOVE BIOGEL PI INDICATOR 8.5 (GLOVE) ×2
GLOVE SURG ORTHO 8.0 STRL STRW (GLOVE) ×3 IMPLANT
GOWN STRL REUS W/ TWL LRG LVL3 (GOWN DISPOSABLE) ×1 IMPLANT
GOWN STRL REUS W/TWL LRG LVL3 (GOWN DISPOSABLE) ×3
GOWN STRL REUS W/TWL XL LVL3 (GOWN DISPOSABLE) ×3 IMPLANT
NDL PRECISIONGLIDE 27X1.5 (NEEDLE) IMPLANT
NDL SAFETY ECLIPSE 18X1.5 (NEEDLE) IMPLANT
NEEDLE HYPO 18GX1.5 SHARP (NEEDLE)
NEEDLE PRECISIONGLIDE 27X1.5 (NEEDLE) ×3 IMPLANT
NS IRRIG 1000ML POUR BTL (IV SOLUTION) ×3 IMPLANT
PACK BASIN DAY SURGERY FS (CUSTOM PROCEDURE TRAY) ×3 IMPLANT
PAD CAST 3X4 CTTN HI CHSV (CAST SUPPLIES) IMPLANT
PADDING CAST COTTON 3X4 STRL (CAST SUPPLIES)
SPLINT PLASTER CAST XFAST 3X15 (CAST SUPPLIES) IMPLANT
SPLINT PLASTER XTRA FASTSET 3X (CAST SUPPLIES)
STOCKINETTE 4X48 STRL (DRAPES) ×3 IMPLANT
SUT ETHILON 4 0 PS 2 18 (SUTURE) ×3 IMPLANT
SUT ETHILON 5 0 PS 2 18 (SUTURE) ×2 IMPLANT
SUT VIC AB 4-0 P2 18 (SUTURE) IMPLANT
SYR BULB 3OZ (MISCELLANEOUS) ×3 IMPLANT
SYR CONTROL 10ML LL (SYRINGE) ×2 IMPLANT
TOWEL OR 17X24 6PK STRL BLUE (TOWEL DISPOSABLE) ×3 IMPLANT
UNDERPAD 30X30 (UNDERPADS AND DIAPERS) ×3 IMPLANT

## 2015-06-26 NOTE — H&P (Signed)
Catherine Morgan is a 64 year old left hand dominant female who is  complaining of a mass in the palmar aspect slightly ulnar to the 5th metacarpal MCP joint crease volarly. This has been present for approximately 2 weeks when she hit something. She did realize it had been there prior to that time. She has no history of injury. She is complaining of a constant, aching type pain. She feels as though it is gradually getting worse. She is not complaining of any numbness or tingling.   PAST MEDICAL HISTORY:  She is allergic to PCN. She is on the following medications: Cymbalta, Gabapentin, Cyclobenzaprine, Metoprolol, Naproxen, Citalopram and Lisinopril. She has had bilateral carpal tunnel releases, hysterectomy, neck surgery, breast reduction surgery, knee surgery.  FAMILY MEDICAL HISTORY: Positive for heart disease, high BP and arthritis.  SOCIAL HISTORY:  She does not smoke. She drinks socially. She is widowed and a Emergency planning/management officer.  REVIEW OF SYSTEMS: Positive for glasses, high BP, cough, TB, stomach ulcers, lumps, sleep disorder, easy bruising. Otherwise negative 14 points.  Catherine Morgan is an 63 y.o. female.   Chief Complaint: mass right small finger HPI: see above  Past Medical History  Diagnosis Date  . Hypertension   . Hyperlipidemia   . OSA (obstructive sleep apnea)     uses CPAP nightly  . Anxiety   . Depression   . Fibromyalgia   . GERD (gastroesophageal reflux disease)     Past Surgical History  Procedure Laterality Date  . Carpal tunnel release    . Reduction mammaplasty    . Abdominal hysterectomy    . Fracture surgery      lt elbow    Family History  Problem Relation Age of Onset  . Heart disease Father    Social History:  reports that she has never smoked. She does not have any smokeless tobacco history on file. She reports that she drinks alcohol. Her drug history is not on file.  Allergies:  Allergies  Allergen Reactions  . Oxycodone Itching  . Penicillins      Medications Prior to Admission  Medication Sig Dispense Refill  . Calcium Citrate-Vitamin D (CITRACAL + D PO) Take by mouth.    . DULoxetine (CYMBALTA) 60 MG capsule Take 60 mg by mouth daily.     Marland Kitchen gabapentin (NEURONTIN) 600 MG tablet Take 600 mg by mouth 2 (two) times daily.     . Glucosamine-Chondroitin (GLUCOSAMINE CHONDR COMPLEX PO) Take by mouth.    Marland Kitchen lisinopril-hydrochlorothiazide (PRINZIDE,ZESTORETIC) 10-12.5 MG per tablet Take by mouth daily.     . metoprolol succinate (TOPROL-XL) 50 MG 24 hr tablet Take 50 mg by mouth daily.     Marland Kitchen MICRONIZED COLESTIPOL HCL 1 G tablet Take 1 g by mouth daily.     . Multiple Vitamins-Minerals (MULTIVITAMIN PO) Take by mouth.    . naproxen (NAPROSYN) 500 MG tablet Take 500 mg by mouth 2 (two) times daily with a meal.     . NEXIUM 40 MG capsule Take 40 mg by mouth daily at 12 noon.     . Omega-3 Krill Oil 1000 MG CAPS Take by mouth.      Results for orders placed or performed during the hospital encounter of 06/26/15 (from the past 48 hour(s))  Basic metabolic panel     Status: None   Collection Time: 06/25/15 11:15 AM  Result Value Ref Range   Sodium 143 135 - 145 mmol/L   Potassium 3.9 3.5 - 5.1 mmol/L  Chloride 109 101 - 111 mmol/L   CO2 28 22 - 32 mmol/L   Glucose, Bld 97 65 - 99 mg/dL   BUN 14 6 - 20 mg/dL   Creatinine, Ser 0.76 0.44 - 1.00 mg/dL   Calcium 9.4 8.9 - 10.3 mg/dL   GFR calc non Af Amer >60 >60 mL/min   GFR calc Af Amer >60 >60 mL/min    Comment: (NOTE) The eGFR has been calculated using the CKD EPI equation. This calculation has not been validated in all clinical situations. eGFR's persistently <60 mL/min signify possible Chronic Kidney Disease.    Anion gap 6 5 - 15    No results found.   Pertinent items are noted in HPI.  Blood pressure 142/83, pulse 76, temperature 98.1 F (36.7 C), temperature source Oral, resp. rate 18, height 4' 11.5" (1.511 m), weight 75.07 kg (165 lb 8 oz), SpO2 100 %.  General  appearance: alert, cooperative and appears stated age Head: Normocephalic, without obvious abnormality Neck: no JVD Resp: clear to auscultation bilaterally Cardio: regular rate and rhythm, S1, S2 normal, no murmur, click, rub or gallop GI: soft, non-tender; bowel sounds normal; no masses,  no organomegaly Extremities: mass mcp volar ulna small finger right hand Pulses: 2+ and symmetric Skin: Skin color, texture, turgor normal. No rashes or lesions Neurologic: Grossly normal Incision/Wound: na  Assessment/Plan X-rays are negative.   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 We have discussed this with her. We would recommend serious consideration be given to surgical excision of this. Pre, peri and post op care are discussed along with risks and complications. Patient is aware there is no guarantee with surgery, possibility of infection, injury to arteries, nerves, and tendons, incomplete relief and dystrophy. She would like to have this excised. This will be scheduled as an outpatient under regional anesthesia excision mass right small finger at the DIP joint crease.  Jakarius Flamenco R 06/26/2015, 9:13 AM

## 2015-06-26 NOTE — Anesthesia Postprocedure Evaluation (Signed)
  Anesthesia Post-op Note  Patient: Catherine Morgan  Procedure(s) Performed: Procedure(s) with comments: EXCISION MASS RIGHT SMALL FINGER (Right) - REGIONAL/FAB  Patient Location: PACU  Anesthesia Type: General   Level of Consciousness: awake, alert  and oriented  Airway and Oxygen Therapy: Patient Spontanous Breathing  Post-op Pain: mild  Post-op Assessment: Post-op Vital signs reviewed  Post-op Vital Signs: Reviewed  Last Vitals:  Filed Vitals:   06/26/15 1125  BP: 152/79  Pulse: 88  Temp: 36.4 C  Resp:     Complications: No apparent anesthesia complications

## 2015-06-26 NOTE — Anesthesia Preprocedure Evaluation (Signed)
Anesthesia Evaluation  Patient identified by MRN, date of birth, ID band Patient awake    Reviewed: Allergy & Precautions, NPO status , Patient's Chart, lab work & pertinent test results, reviewed documented beta blocker date and time   Airway Mallampati: II  TM Distance: >3 FB Neck ROM: Full    Dental  (+) Teeth Intact, Partial Upper, Dental Advisory Given   Pulmonary sleep apnea and Continuous Positive Airway Pressure Ventilation ,  breath sounds clear to auscultation        Cardiovascular hypertension, Pt. on medications and Pt. on home beta blockers Rhythm:Regular Rate:Normal     Neuro/Psych    GI/Hepatic GERD-  Medicated and Controlled,  Endo/Other  Morbid obesity  Renal/GU      Musculoskeletal   Abdominal   Peds  Hematology   Anesthesia Other Findings   Reproductive/Obstetrics                             Anesthesia Physical Anesthesia Plan  ASA: III  Anesthesia Plan: General   Post-op Pain Management:    Induction: Intravenous  Airway Management Planned: LMA  Additional Equipment:   Intra-op Plan:   Post-operative Plan: Extubation in OR  Informed Consent: I have reviewed the patients History and Physical, chart, labs and discussed the procedure including the risks, benefits and alternatives for the proposed anesthesia with the patient or authorized representative who has indicated his/her understanding and acceptance.   Dental advisory given  Plan Discussed with: CRNA, Anesthesiologist and Surgeon  Anesthesia Plan Comments:         Anesthesia Quick Evaluation

## 2015-06-26 NOTE — Brief Op Note (Signed)
06/26/2015  10:41 AM  PATIENT:  Caryl BisYvonne L Lapiana  64 y.o. female  PRE-OPERATIVE DIAGNOSIS:  MASS RIGHT SMALL FINGER  POST-OPERATIVE DIAGNOSIS:  MASS RIGHT SMALL FINGER  PROCEDURE:  Procedure(s) with comments: EXCISION MASS RIGHT SMALL FINGER (Right) - REGIONAL/FAB  SURGEON:  Surgeon(s) and Role:    * Cindee SaltGary Timonthy Hovater, MD - Primary  PHYSICIAN ASSISTANT:   ASSISTANTS: none   ANESTHESIA:   local and regional  EBL:     BLOOD ADMINISTERED:none  DRAINS: none   LOCAL MEDICATIONS USED:  BUPIVICAINE   SPECIMEN:  Excision  DISPOSITION OF SPECIMEN:  PATHOLOGY  COUNTS:  YES  TOURNIQUET:   Total Tourniquet Time Documented: Forearm (Right) - 15 minutes Total: Forearm (Right) - 15 minutes   DICTATION: .Other Dictation: Dictation Number [id number]  PLAN OF CARE: Discharge to home after PACU  PATIENT DISPOSITION:  PACU - hemodynamically stable.

## 2015-06-26 NOTE — Anesthesia Procedure Notes (Signed)
Procedure Name: LMA Insertion Date/Time: 06/26/2015 10:12 AM Performed by: Gar GibbonKEETON, Breane Grunwald S Pre-anesthesia Checklist: Patient identified, Emergency Drugs available, Suction available and Patient being monitored Patient Re-evaluated:Patient Re-evaluated prior to inductionOxygen Delivery Method: Circle System Utilized Preoxygenation: Pre-oxygenation with 100% oxygen Intubation Type: IV induction Ventilation: Mask ventilation without difficulty LMA: LMA inserted LMA Size: 4.0 Number of attempts: 1 Airway Equipment and Method: Bite block Placement Confirmation: positive ETCO2 Tube secured with: Tape Dental Injury: Teeth and Oropharynx as per pre-operative assessment

## 2015-06-26 NOTE — Op Note (Signed)
Dictation Number [id number]

## 2015-06-26 NOTE — Transfer of Care (Signed)
Immediate Anesthesia Transfer of Care Note  Patient: Catherine Morgan  Procedure(s) Performed: Procedure(s) with comments: EXCISION MASS RIGHT SMALL FINGER (Right) - REGIONAL/FAB  Patient Location: PACU  Anesthesia Type:General  Level of Consciousness: awake, sedated and patient cooperative  Airway & Oxygen Therapy: Patient Spontanous Breathing and Patient connected to face mask oxygen  Post-op Assessment: Report given to RN and Post -op Vital signs reviewed and stable  Post vital signs: Reviewed and stable  Last Vitals:  Filed Vitals:   06/26/15 0844  BP: 142/83  Pulse: 76  Temp: 36.7 C  Resp: 18    Complications: No apparent anesthesia complications

## 2015-06-26 NOTE — Discharge Instructions (Addendum)

## 2015-06-27 ENCOUNTER — Encounter (HOSPITAL_BASED_OUTPATIENT_CLINIC_OR_DEPARTMENT_OTHER): Payer: Self-pay | Admitting: Orthopedic Surgery

## 2015-06-27 NOTE — Op Note (Signed)
NAMMeriel Morgan:  Dueitt, Toniya               ACCOUNT NO.:  [id number]  MEDICAL RECORD NO.:  [id number]  LOCATION:                               FACILITY:  [facility]  PHYSICIAN:  Cindee SaltGary Zaina Jenkin, M.D.       DATE OF BIRTH:  06/22/1951  DATE OF PROCEDURE:  06/26/2015 DATE OF DISCHARGE:  06/26/2015                              OPERATIVE REPORT   PREOPERATIVE DIAGNOSIS:  Mass, right small finger, metacarpophalangeal joint crease, volar aspect, ulnar side.  POSTOPERATIVE DIAGNOSIS:  Mass, right small finger, metacarpophalangeal joint crease, volar aspect, ulnar side.  OPERATION:  Excision of mass, right small finger.  SURGEON:  Cindee SaltGary Leighanne Adolph, M.D.  ANESTHESIA:  General with local infiltration.  ANESTHESIOLOGIST:  Sheldon Silvanavid Crews, M.D.  HISTORY:  The patient is a 64 year old female with a history of mass over the metacarpophalangeal joint crease of her right small finger ulnar aspect volarly.  She is desirous of having this excised.  Pre, peri, and postoperative course were discussed along with risks and complications.  She is aware that there is no guarantee with the surgery; possibility of infection; recurrence of injury to arteries, nerves, tendons; incomplete relief of symptoms and dystrophy.  In the preoperative area, the patient is seen, the extremity marked by both patient and surgeon, and antibiotic given.  DESCRIPTION OF PROCEDURE:  The patient was brought to the operating room, where general anesthetic was carried out without difficulty.  She was prepped using ChloraPrep, supine position with the right arm free. A 3-minute dry time was allowed.  Time-out taken, confirming the patient and procedure.  The limb was exsanguinated with an Esmarch bandage. Tourniquet placed high on the arm was inflated to 250 mmHg.  An oblique incision was made directly over the mass, carried down through the subcutaneous tissue.  Bleeders were electrocauterized with bipolar. Multilobulated blood-filled tumor was  immediately encountered.  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  This was coagulated and the tumor excised in toto and sent to Pathology.  The wound was copiously irrigated with saline.  The skin was then closed with interrupted 5-0 nylon sutures.  The local infiltration with 0.25% bupivacaine without epinephrine was given, approximately 5-6 mL was used.  Sterile compressive dressing was applied.  On deflation of the tourniquet, the finger was immediately pinked.  She was taken to the recovery room for observation in satisfactory condition. She will be discharged to home [facility] [place] [facility] on [place].    ______________________________ Cindee SaltGary Makaylah Oddo, M.D.   ______________________________ Cindee SaltGary Daylah Sayavong, M.D.    GK/MEDQ  D:  06/26/2015  T:  06/27/2015  Job:  [id number]

## 2016-04-02 ENCOUNTER — Other Ambulatory Visit: Payer: Self-pay | Admitting: Obstetrics and Gynecology

## 2016-04-02 DIAGNOSIS — N63 Unspecified lump in unspecified breast: Secondary | ICD-10-CM

## 2016-04-07 ENCOUNTER — Ambulatory Visit
Admission: RE | Admit: 2016-04-07 | Discharge: 2016-04-07 | Disposition: A | Discharge status: Home / Self Care | Payer: Medicare Other | Source: Ambulatory Visit | Attending: Obstetrics and Gynecology | Admitting: Obstetrics and Gynecology

## 2016-04-07 ENCOUNTER — Other Ambulatory Visit: Payer: Self-pay | Admitting: Obstetrics and Gynecology

## 2016-04-07 DIAGNOSIS — N63 Unspecified lump in unspecified breast: Secondary | ICD-10-CM

## 2017-04-29 ENCOUNTER — Encounter (INDEPENDENT_AMBULATORY_CARE_PROVIDER_SITE_OTHER): Payer: Self-pay | Admitting: Orthopaedic Surgery

## 2017-04-29 ENCOUNTER — Ambulatory Visit (INDEPENDENT_AMBULATORY_CARE_PROVIDER_SITE_OTHER): Payer: Medicare Other | Admitting: Orthopaedic Surgery

## 2017-04-29 VITALS — BP 112/73 | HR 68 | Ht 59.5 in | Wt 157.0 lb

## 2017-04-29 DIAGNOSIS — M67979 Unspecified disorder of synovium and tendon, unspecified ankle and foot: Secondary | ICD-10-CM | POA: Diagnosis not present

## 2017-04-29 NOTE — Progress Notes (Signed)
Office Visit Note   Patient: Catherine Morgan           Date of Birth: 01/12/1951           MRN: [id number] Visit Date: 04/29/2017              Requested by: Marin CommentWells, Cheryl, FNP [address] [place], [place] [id number] PCP: Marin CommentWells, Cheryl, FNP   Assessment & Plan: Visit Diagnoses:  1. Tibialis posterior tendinopathy     Plan: We discussed inserts with higher arch such as the blue Superfeet. She can get some over-the-counter arch inserts. We discussed the posterior tibial tendon function and likely tendinopathy that she has present. Her symptoms are not severe enough to consider imaging or surgical intervention. She'll use inserts with high arched unload the posterior tibial tendon bilaterally and can return if she has increased symptoms or decides to get the left subacromial shoulder injection.  Follow-Up Instructions: Return if symptoms worsen or fail to improve.   Orders:  No orders of the defined types were placed in this encounter.  No orders of the defined types were placed in this encounter.     Procedures: No procedures performed   Clinical Data: No additional findings.   Subjective: Chief Complaint  Patient presents with  . Left Shoulder - Pain  . Right Foot - Pain    HPI patient's had bilateral foot pain worse on the right than left. She has bilateral pes planus and has had pain at the medial midfoot. It's worse the more she walks she had [facility] brought them with her which showed some first MTP arthritis with joint space narrowing and small bunion but she's not having any pain in that area. She brought some sandals that have high arch and this seems to help. Previous injection of her left shoulder with some recurrent symptoms she is left-hand dominant. She still can get her arm overhead fix her hair. Previous two-level cervical fusion doing well but she has some pain that radiates from C7 down to about T6 is worse in the morning but then  improves during the day. Denies fever chills. Previous left L5-S1 microdiscectomy years ago with some mild back discomfort. Her foot pain is worse the more she walks worse on the right than left not associated with her back pain and she gets relief with the sitting.  Review of Systems  Constitutional: Negative for chills and diaphoresis.  HENT: Negative for ear discharge, ear pain and nosebleeds.   Eyes: Negative for discharge and visual disturbance.  Respiratory: Negative for cough, choking and shortness of breath.   Cardiovascular: Negative for chest pain and palpitations.  Gastrointestinal: Negative for abdominal distention and abdominal pain.  Endocrine: Negative for cold intolerance and heat intolerance.  Genitourinary: Negative for flank pain and hematuria.  Musculoskeletal:       Positive for C4-5 C5-6 ACDF. She has adjacent disc degeneration on plain x-rays at C6-7. Previous L5-S1 microdiscectomy. Positive for foot pain worse on the right than left.  Skin: Negative for rash and wound.  Neurological: Negative for seizures and speech difficulty.  Hematological: Negative for adenopathy. Does not bruise/bleed easily.  Psychiatric/Behavioral: Negative for agitation and suicidal ideas.     Objective: Vital Signs: BP 112/73   Pulse 68   Ht 4' 11.5" (1.511 m)   Wt 157 lb (71.2 kg)   BMI 31.18 kg/m   Physical Exam  Constitutional: She is oriented to person, place, and time. She appears  well-developed.  HENT:  Head: Normocephalic.  Right Ear: External ear normal.  Left Ear: External ear normal.  Eyes: Pupils are equal, round, and reactive to light.  Neck: No tracheal deviation present. No thyromegaly present.  Cardiovascular: Normal rate.   Pulmonary/Chest: Effort normal.  Abdominal: Soft.  Musculoskeletal:  Well-healed left anterior cervical incision negative Spurling good cervical range of motion mild impingement left shoulder. Grip strength is good good elbow flexion. She has  a palpable accessory navicular present both right and left more tender on the right than left with tenderness along the posterior tibial tendon. Bilateral pes planus. Anterior tib EHL is intact. Pain with the toe walking. She is able to heel walk without discomfort.  Neurological: She is alert and oriented to person, place, and time.  Skin: Skin is warm and dry.  Psychiatric: She has a normal mood and affect. Her behavior is normal.    Ortho Exam  Specialty Comments:  No specialty comments available.  Imaging: Outside x-[facility] from [facility] of her right foot are reviewed. This shows first MTP osteoarthritis with joint space narrowing and some spurring. Mildly prominent bunion. Right accessory navicular bone present without talonavicular degenerative changes.   PMFS History: Patient Active Problem List   Diagnosis Date Noted  . Obesity, unspecified 11/22/2013  . Essential hypertension, benign 11/22/2013   Past Medical History:  Diagnosis Date  . Anxiety   . Depression   . Fibromyalgia   . GERD (gastroesophageal reflux disease)   . Hyperlipidemia   . Hypertension   . OSA (obstructive sleep apnea)    uses CPAP nightly    Family History  Problem Relation Age of Onset  . Heart disease Father     Past Surgical History:  Procedure Laterality Date  . ABDOMINAL HYSTERECTOMY    . CARPAL TUNNEL RELEASE    . FRACTURE SURGERY     lt elbow  . MASS EXCISION Right 06/26/2015   Procedure: EXCISION MASS RIGHT SMALL FINGER;  Surgeon: Cindee Salt, MD;  Location: [facility];  Service: Orthopedics;  Laterality: Right;  REGIONAL/FAB  . REDUCTION MAMMAPLASTY     Social History   Occupational History  . Not on file.   Social History Main Topics  . Smoking status: Never Smoker  . Smokeless tobacco: Never Used  . Alcohol use Yes     Comment: social  . Drug use: No  . Sexual activity: Not on file

## 2018-05-25 ENCOUNTER — Other Ambulatory Visit: Payer: Self-pay | Admitting: Obstetrics and Gynecology

## 2018-05-25 DIAGNOSIS — N6489 Other specified disorders of breast: Secondary | ICD-10-CM

## 2018-06-07 ENCOUNTER — Ambulatory Visit
Admission: RE | Admit: 2018-06-07 | Discharge: 2018-06-07 | Disposition: A | Discharge status: Home / Self Care | Payer: Medicare Other | Source: Ambulatory Visit | Attending: Obstetrics and Gynecology | Admitting: Obstetrics and Gynecology

## 2018-06-07 DIAGNOSIS — N6489 Other specified disorders of breast: Secondary | ICD-10-CM

## 2018-09-03 ENCOUNTER — Ambulatory Visit (INDEPENDENT_AMBULATORY_CARE_PROVIDER_SITE_OTHER): Payer: Medicare Other | Admitting: Orthopaedic Surgery

## 2018-09-03 ENCOUNTER — Encounter (INDEPENDENT_AMBULATORY_CARE_PROVIDER_SITE_OTHER): Payer: Self-pay | Admitting: Orthopaedic Surgery

## 2018-09-03 VITALS — BP 104/62 | HR 70 | Ht 59.0 in | Wt 159.0 lb

## 2018-09-03 DIAGNOSIS — Z981 Arthrodesis status: Secondary | ICD-10-CM | POA: Diagnosis not present

## 2018-09-03 DIAGNOSIS — M47812 Spondylosis without myelopathy or radiculopathy, cervical region: Secondary | ICD-10-CM | POA: Diagnosis not present

## 2018-09-03 MED ORDER — DIAZEPAM 5 MG PO TABS: ORAL_TABLET | ORAL | 0 refills | Status: DC

## 2018-09-03 NOTE — Progress Notes (Signed)
Office Visit Note   Patient: Catherine Morgan           Date of Birth: 09/21/1951           MRN: [id number] Visit Date: 09/03/2018              Requested by: Marin CommentWells, Cheryl, FNP [address] [place] [address] [place], [place] [id number] PCP: Marin CommentWells, Cheryl, FNP   Assessment & Plan: Visit Diagnoses:  1. Spondylosis without myelopathy or radiculopathy, cervical region   2. History of fusion of cervical spine     Plan: Patient states her symptoms been significant.  She is done some exercises taken anti-inflammatories, Neurontin, muscle relaxants without relief.  X-rays demonstrate progression of C6-7 disc space narrowing spurring from 2012-2019.  She would like to proceed with cervical MRI scan.  Office follow-up after scan for review.  Follow-Up Instructions: No follow-ups on file.   Orders:  No orders of the defined types were placed in this encounter.  No orders of the defined types were placed in this encounter.     Procedures: No procedures performed   Clinical Data: No additional findings.   Subjective: Chief Complaint  Patient presents with  . Neck - Pain  . Middle Back - Pain    HPI 67 year old female returns with ongoing problems with neck pain for the last 6 months with posterior headaches pain that radiates into her arm down to her wrist.  She had previous cervical fusion 2012 at C4-5 C5-6 with solid healing.  Medial lateral right knee meniscectomies 2015 doing well.  States she has pain with rotation of her neck aching pain it bothers her at night she has increased pain when she looks up.  She had x-rays done at an outside urgent care was told that she had cervical spondylosis with disc degeneration below her fusion.  She is been on Neurontin also Cymbalta, Naprosyn without relief.  Currently she is not taking any Norco.  Review of Systems previous cyst removal of her finger Dr. Merlyn LotKuzma, knee arthroscopy 2015 right knee, C4-C6 fusion 2012, breast reduction within last 2 years,  hypertension and obesity.   Objective: Vital Signs: BP 104/62   Pulse 70   Ht 4\' 11"  (1.499 m)   Wt 159 lb (72.1 kg)   BMI 32.11 kg/m   Physical Exam  Constitutional: She is oriented to person, place, and time. She appears well-developed.  HENT:  Head: Normocephalic.  Right Ear: External ear normal.  Left Ear: External ear normal.  Eyes: Pupils are equal, round, and reactive to light.  Neck: No tracheal deviation present. No thyromegaly present.  Cardiovascular: Normal rate.  Pulmonary/Chest: Effort normal.  Abdominal: Soft.  Neurological: She is alert and oriented to person, place, and time.  Skin: Skin is warm and dry.  Psychiatric: She has a normal mood and affect. Her behavior is normal.    Ortho Exam patient has bilateral brachial plexus tenderness worse on the right than left positive Spurling on the right.  Upper extremity reflexes are 1+ and symmetrical well-healed anterior cervical incision thyroid is not enlarged no supra clavicular lymphadenopathy.  Increased pain with cervical compression and increased pain with cervical flexion 2 fingerbreadths chin to chest and also pain with extension.  No improvement with distraction.  Lower extremity flexors are 2+ normal heel toe gait no clonus no hyperreflexia lower extremities.  Shoulder show no impingement.  Healed right carpal tunnel release incision.  Specialty Comments:  No specialty comments available.  Imaging: No results  found.   PMFS History: Patient Active Problem List   Diagnosis Date Noted  . Spondylosis without myelopathy or radiculopathy, cervical region 09/03/2018  . History of fusion of cervical spine 09/03/2018  . Obesity, unspecified 11/22/2013  . Essential hypertension, benign 11/22/2013   Past Medical History:  Diagnosis Date  . Anxiety   . Depression   . Fibromyalgia   . GERD (gastroesophageal reflux disease)   . Hyperlipidemia   . Hypertension   . OSA (obstructive sleep apnea)    uses CPAP  nightly    Family History  Problem Relation Age of Onset  . Heart disease Father     Past Surgical History:  Procedure Laterality Date  . ABDOMINAL HYSTERECTOMY    . CARPAL TUNNEL RELEASE    . FRACTURE SURGERY     lt elbow  . MASS EXCISION Right 06/26/2015   Procedure: EXCISION MASS RIGHT SMALL FINGER;  Surgeon: Cindee Salt, MD;  Location: [facility];  Service: Orthopedics;  Laterality: Right;  REGIONAL/FAB  . REDUCTION MAMMAPLASTY     Social History   Occupational History  . Not on file  Tobacco Use  . Smoking status: Never Smoker  . Smokeless tobacco: Never Used  Substance and Sexual Activity  . Alcohol use: Yes    Comment: social  . Drug use: No  . Sexual activity: Not on file

## 2018-09-06 ENCOUNTER — Encounter (INDEPENDENT_AMBULATORY_CARE_PROVIDER_SITE_OTHER): Payer: Self-pay | Admitting: Orthopaedic Surgery

## 2018-09-19 ENCOUNTER — Other Ambulatory Visit: Payer: Medicare Other

## 2018-09-24 ENCOUNTER — Ambulatory Visit
Admission: RE | Admit: 2018-09-24 | Discharge: 2018-09-24 | Disposition: A | Discharge status: Home / Self Care | Payer: Medicare Other | Source: Ambulatory Visit | Attending: Orthopaedic Surgery | Admitting: Orthopaedic Surgery

## 2018-09-24 DIAGNOSIS — M47812 Spondylosis without myelopathy or radiculopathy, cervical region: Secondary | ICD-10-CM

## 2018-09-28 ENCOUNTER — Encounter (INDEPENDENT_AMBULATORY_CARE_PROVIDER_SITE_OTHER): Payer: Self-pay | Admitting: Orthopaedic Surgery

## 2018-09-28 ENCOUNTER — Ambulatory Visit (INDEPENDENT_AMBULATORY_CARE_PROVIDER_SITE_OTHER): Payer: Medicare Other | Admitting: Orthopaedic Surgery

## 2018-09-28 VITALS — BP 145/80 | HR 73 | Ht 59.0 in | Wt 159.0 lb

## 2018-09-28 DIAGNOSIS — M47812 Spondylosis without myelopathy or radiculopathy, cervical region: Secondary | ICD-10-CM | POA: Diagnosis not present

## 2018-09-28 NOTE — Progress Notes (Signed)
Office Visit Note   Patient: Catherine Morgan           Date of Birth: [birth year]           MRN: [id number] Visit Date: 09/28/2018              Requested by: Marin Comment, FNP [address] [place], [place] [address] PCP: Marin Comment, FNP   Assessment & Plan: Visit Diagnoses:  1. Spondylosis without myelopathy or radiculopathy, cervical region     Plan: We discussed continue work on a walking program work on weight loss.  We reviewed the MRI scan I gave her a copy of the report.  I can recheck if she has increased symptoms.  She does have since disc protrusion at T1-2 without cord compression.  Some disc protrusion at C6-7 below her solid 2 level fusion.  Nonoperative treatment recommended and discussed in detail.  Follow-Up Instructions: Return if symptoms worsen or fail to improve.   Orders:  No orders of the defined types were placed in this encounter.  No orders of the defined types were placed in this encounter.     Procedures: No procedures performed   Clinical Data: No additional findings.   Subjective: Chief Complaint  Patient presents with  . Neck - Pain, Follow-up    MRI Cervical Spine Review     HPI 67 year old female returns with ongoing problems with cervical spondylosis posterior neck pain and associated intermittent headache problems.  She has had previous cervical fusion at C4-5, C5-6 and 2012 by Dr. Ophelia Charter.  Been treated with Neurontin, Cymbalta, Naprosyn without relief.  MRI scan has been obtained to evaluate C6-7 level.  Review of Systems 14 point update unchanged from 09/03/2018.  She is had some weight gain, breast reduction surgery within the last 2 years.  Does have hypertension and previous cervical fusion.   Objective: Vital Signs: BP (!) 145/80   Pulse 73   Ht 4\' 11"  (1.499 m)   Wt 159 lb (72.1 kg)   BMI 32.11 kg/m   Physical Exam  Constitutional: She is oriented to person, place, and time. She appears well-developed.  HENT:    Head: Normocephalic.  Right Ear: External ear normal.  Left Ear: External ear normal.  Eyes: Pupils are equal, round, and reactive to light.  Neck: No tracheal deviation present. No thyromegaly present.  Cardiovascular: Normal rate.  Pulmonary/Chest: Effort normal.  Abdominal: Soft.  Neurological: She is alert and oriented to person, place, and time.  Skin: Skin is warm and dry.  Psychiatric: She has a normal mood and affect. Her behavior is normal.    Ortho Exam intact reflexes mild brachial plexus tenderness.  No lower extremity clonus no lymphadenopathy.  Healed carpal tunnel incision on the right.  Some increased pain with compression.  Specialty Comments:  No specialty comments available.  Imaging:CLINICAL DATA:  Cervical spondylosis without myelopathy. Cervical fusion  EXAM: MRI CERVICAL SPINE WITHOUT CONTRAST  TECHNIQUE: Multiplanar, multisequence MR imaging of the cervical spine was performed. No intravenous contrast was administered.  COMPARISON:  Cervical radiographs 02/09/2018, MRI 08/25/2011  FINDINGS: Alignment: Mild anterolisthesis C7-T1.  Remaining alignment normal.  Vertebrae: ACDF C4-5 and C5-6.  Negative for fracture or mass  Cord: Normal spinal cord signal.  Posterior Fossa, vertebral arteries, paraspinal tissues: Negative  Disc levels:  C2-3: Small central disc protrusion and mild spurring. Mild right foraminal narrowing due to facet hypertrophy and uncinate spurring.  C3-4: Mild disc degeneration and spurring. Left-sided facet hypertrophy.  Mild left foraminal stenosis  C4-5: ACDF with solid fusion.  Negative for stenosis  C5-6: ACDF with solid fusion.  Negative for stenosis.  C6-7: Moderate to advanced disc degeneration. Diffuse uncinate spurring. Mild foraminal narrowing bilaterally due to spurring.  C7-T1: Mild anterolisthesis. Mild facet degeneration. No significant stenosis  T1-2: Advanced disc degeneration with  prominent spurring. Mild spinal stenosis and moderate foraminal stenosis bilaterally.  IMPRESSION: ACDF with solid fusion at C4-5 and C5-6  Mild left foraminal stenosis at C3-4. Mild right foraminal narrowing C2-3.  Mild foraminal narrowing bilaterally at C6-7.  Mild spinal stenosis and moderate foraminal stenosis bilaterally at T1-2 secondary to spurring.   Electronically Signed   By: Marlan Palau M.D.   On: 09/25/2018 11:11     PMFS History: Patient Active Problem List   Diagnosis Date Noted  . Spondylosis without myelopathy or radiculopathy, cervical region 09/03/2018  . History of fusion of cervical spine 09/03/2018  . Obesity, unspecified 11/22/2013  . Essential hypertension, benign 11/22/2013   Past Medical History:  Diagnosis Date  . Anxiety   . Depression   . Fibromyalgia   . GERD (gastroesophageal reflux disease)   . Hyperlipidemia   . Hypertension   . OSA (obstructive sleep apnea)    uses CPAP nightly    Family History  Problem Relation Age of Onset  . Heart disease Father     Past Surgical History:  Procedure Laterality Date  . ABDOMINAL HYSTERECTOMY    . CARPAL TUNNEL RELEASE    . FRACTURE SURGERY     lt elbow  . MASS EXCISION Right 06/26/2015   Procedure: EXCISION MASS RIGHT SMALL FINGER;  Surgeon: Cindee Salt, MD;  Location: [facility];  Service: Orthopedics;  Laterality: Right;  REGIONAL/FAB  . REDUCTION MAMMAPLASTY     Social History   Occupational History  . Not on file  Tobacco Use  . Smoking status: Never Smoker  . Smokeless tobacco: Never Used  Substance and Sexual Activity  . Alcohol use: Yes    Comment: social  . Drug use: No  . Sexual activity: Not on file

## 2018-10-19 ENCOUNTER — Ambulatory Visit (INDEPENDENT_AMBULATORY_CARE_PROVIDER_SITE_OTHER): Payer: Medicare Other | Admitting: Cardiology

## 2018-10-19 ENCOUNTER — Encounter: Payer: Self-pay | Admitting: Cardiology

## 2018-10-19 VITALS — BP 118/62 | HR 73 | Ht 59.0 in | Wt 161.0 lb

## 2018-10-19 DIAGNOSIS — I209 Angina pectoris, unspecified: Secondary | ICD-10-CM

## 2018-10-19 DIAGNOSIS — I10 Essential (primary) hypertension: Secondary | ICD-10-CM

## 2018-10-19 MED ORDER — NITROGLYCERIN 0.4 MG SL SUBL: 0.4000 mg | SUBLINGUAL_TABLET | SUBLINGUAL | 11 refills | Status: DC | PRN

## 2018-10-19 MED ORDER — ASPIRIN EC 81 MG PO TBEC: 81.0000 mg | DELAYED_RELEASE_TABLET | Freq: Every day | ORAL | 3 refills | Status: AC

## 2018-10-19 NOTE — Patient Instructions (Signed)
Medication Instructions:  Your physician has recommended you make the following change in your medication:   START: Nitroglycerin 0.4 mg every 5 min as needed.                                Aspirin 81 mg daily    If you need a refill on your cardiac medications before your next appointment, please call your pharmacy.   Lab work: None If you have labs (blood work) drawn today and your tests are completely normal, you will receive your results only by: Marland Kitchen MyChart Message (if you have MyChart) OR . A paper copy in the mail If you have any lab test that is abnormal or we need to change your treatment, we will call you to review the results.  Testing/Procedures: Your physician has requested that you have en exercise stress myoview. For further information please visit [email or web address] Please follow instruction sheet, as given.   Follow-Up: At [facility], you and your health needs are our priority.  As part of our continuing mission to provide you with exceptional heart care, we have created designated Provider Care Teams.  These Care Teams include your primary Cardiologist (physician) and Advanced Practice Providers (APPs -  Physician Assistants and Nurse Practitioners) who all work together to provide you with the care you need, when you need it. You will need a follow up appointment in 3 months.  Please call our office 2 months in advance to schedule this appointment.  You may see No primary care provider on file. or another member of our [employer] Provider Team in [place]: Gypsy Balsam, MD . Norman Herrlich, MD  Any Other Special Instructions Will Be Listed Below (If Applicable). Nitroglycerin sublingual tablets What is this medicine? NITROGLYCERIN (nye troe GLI ser in) is a type of vasodilator. It relaxes blood vessels, increasing the blood and oxygen supply to your heart. This medicine is used to relieve chest pain caused by angina. It is also used to prevent chest pain  before activities like climbing stairs, going outdoors in cold weather, or sexual activity. This medicine may be used for other purposes; ask your health care provider or pharmacist if you have questions. COMMON BRAND NAME(S): Nitroquick, Nitrostat, Nitrotab What should I tell my health care provider before I take this medicine? They need to know if you have any of these conditions: -anemia -head injury, recent stroke, or bleeding in the brain -liver disease -previous heart attack -an unusual or allergic reaction to nitroglycerin, other medicines, foods, dyes, or preservatives -pregnant or trying to get pregnant -breast-feeding How should I use this medicine? Take this medicine by mouth as needed. At the first sign of an angina attack (chest pain or tightness) place one tablet under your tongue. You can also take this medicine 5 to 10 minutes before an event likely to produce chest pain. Follow the directions on the prescription label. Let the tablet dissolve under the tongue. Do not swallow whole. Replace the dose if you accidentally swallow it. It will help if your mouth is not dry. Saliva around the tablet will help it to dissolve more quickly. Do not eat or drink, smoke or chew tobacco while a tablet is dissolving. If you are not better within 5 minutes after taking ONE dose of nitroglycerin, call 9-1-1 immediately to seek emergency medical care. Do not take more than 3 nitroglycerin tablets over 15 minutes. If you take this medicine  often to relieve symptoms of angina, your doctor or health care professional may provide you with different instructions to manage your symptoms. If symptoms do not go away after following these instructions, it is important to call 9-1-1 immediately. Do not take more than 3 nitroglycerin tablets over 15 minutes. Talk to your pediatrician regarding the use of this medicine in children. Special care may be needed. Overdosage: If you think you have taken too much of  this medicine contact a poison control center or emergency room at once. NOTE: This medicine is only for you. Do not share this medicine with others. What if I miss a dose? This does not apply. This medicine is only used as needed. What may interact with this medicine? Do not take this medicine with any of the following medications: -certain migraine medicines like ergotamine and dihydroergotamine (DHE) -medicines used to treat erectile dysfunction like sildenafil, tadalafil, and vardenafil -riociguat This medicine may also interact with the following medications: -alteplase -aspirin -heparin -medicines for high blood pressure -medicines for mental depression -other medicines used to treat angina -phenothiazines like chlorpromazine, mesoridazine, prochlorperazine, thioridazine This list may not describe all possible interactions. Give your health care provider a list of all the medicines, herbs, non-prescription drugs, or dietary supplements you use. Also tell them if you smoke, drink alcohol, or use illegal drugs. Some items may interact with your medicine. What should I watch for while using this medicine? Tell your doctor or health care professional if you feel your medicine is no longer working. Keep this medicine with you at all times. Sit or lie down when you take your medicine to prevent falling if you feel dizzy or faint after using it. Try to remain calm. This will help you to feel better faster. If you feel dizzy, take several deep breaths and lie down with your feet propped up, or bend forward with your head resting between your knees. You may get drowsy or dizzy. Do not drive, use machinery, or do anything that needs mental alertness until you know how this drug affects you. Do not stand or sit up quickly, especially if you are an older patient. This reduces the risk of dizzy or fainting spells. Alcohol can make you more drowsy and dizzy. Avoid alcoholic drinks. Do not treat  yourself for coughs, colds, or pain while you are taking this medicine without asking your doctor or health care professional for advice. Some ingredients may increase your blood pressure. What side effects may I notice from receiving this medicine? Side effects that you should report to your doctor or health care professional as soon as possible: -blurred vision -dry mouth -skin rash -sweating -the feeling of extreme pressure in the head -unusually weak or tired Side effects that usually do not require medical attention (report to your doctor or health care professional if they continue or are bothersome): -flushing of the face or neck -headache -irregular heartbeat, palpitations -nausea, vomiting This list may not describe all possible side effects. Call your doctor for medical advice about side effects. You may report side effects to FDA at 1-800-FDA-1088. Where should I keep my medicine? Keep out of the reach of children. Store at room temperature between 20 and 25 degrees C (68 and 77 degrees F). Store in Retail buyer. Protect from light and moisture. Keep tightly closed. Throw away any unused medicine after the expiration date. NOTE: This sheet is a summary. It may not cover all possible information. If you have questions about this medicine, talk  to your doctor, pharmacist, or health care provider.  2018 Elsevier/Gold Standard (2013-09-29 17:57:36) Aspirin capsules or tablets extended release What is this medicine? ASPIRIN (AS pir in) is a pain reliever. It is used to treat mild pain and fever. This medicine is also used as directed by a doctor to prevent and to treat heart attacks, to prevent strokes, and to treat arthritis or inflammation. This medicine may be used for other purposes; ask your health care provider or pharmacist if you have questions. COMMON BRAND NAME(S): Anacin Adult Low Strength, Aspir-Low, Aspir-Trin, Aspirtab, Bayer Advanced Aspirin, Bayer Aspirin, Bayer  Aspirin Regimen, Bayer Genuine Aspirin, Bayer Low Dose Aspirin Regimen, [employer] Aspirin, Bufferin, Bufferin Low Dose, Easprin, Ecotrin, Genacote, Halfprin, MiniPrin, Safety Coated Aspirin, [place] Adult Low Strength, Zero Order Release Aspirin, ZORprin What should I tell my health care provider before I take this medicine? They need to know if you have any of these conditions: -anemia -asthma -bleeding problems -child with chickenpox, the flu, or other viral infection -diabetes -gout -if you frequently drink alcohol containing drinks -kidney disease -liver disease -low level of vitamin K -lupus -smoke tobacco -stomach ulcers or other problems -an unusual or allergic reaction to aspirin, tartrazine dye, other medicines, dyes, or preservatives -pregnant or trying to get pregnant -breast-feeding How should I use this medicine? Take this medicine by mouth with a glass of water. Follow the directions on the package or prescription label. Do not chew, crush, or cut this medicine. You can take this medicine with or without food. If it upsets your stomach, take it with food. Do not take your medicine more often than directed. Talk to your pediatrician regarding the use of this medicine in children. While this drug may be prescribed for children as young as 40 years of age for selected conditions, precautions do apply. Children and teenagers should not use this medicine to treat chicken pox or flu symptoms unless directed by a doctor. Patients over 32 years old may have a stronger reaction and need a smaller dose. Overdosage: If you think you have taken too much of this medicine contact a poison control center or emergency room at once. NOTE: This medicine is only for you. Do not share this medicine with others. What if I miss a dose? If you are taking this medicine on a regular schedule and miss a dose, take it as soon as you can. If it is almost time for your next dose, take only that  dose. Do not take double or extra doses. What may interact with this medicine? Do not take this medicine with any of the following medications: -cidofovir -ketorolac -probenecid This medicine may also interact with the following medications: -alcohol -alendronate -bismuth subsalicylate -flavocoxid -herbal supplements like feverfew, garlic, ginger, ginkgo biloba, horse chestnut -medicines for diabetes or glaucoma like acetazolamide, methazolamide -medicines for gout -medicines that treat or prevent blood clots like enoxaparin, heparin, ticlopidine, warfarin -other aspirin and aspirin-like medicines -NSAIDs, medicines for pain and inflammation, like ibuprofen or naproxen -pemetrexed -sulfinpyrazone -varicella live vaccine This list may not describe all possible interactions. Give your health care provider a list of all the medicines, herbs, non-prescription drugs, or dietary supplements you use. Also tell them if you smoke, drink alcohol, or use illegal drugs. Some items may interact with your medicine. What should I watch for while using this medicine? If you are treating yourself for pain, tell your doctor or health care professional if the pain lasts more than 10 days, if it  gets worse, or if there is a new or different kind of pain. Tell your doctor if you see redness or swelling. Also, check with your doctor if you have a fever that lasts for more than 3 days. Only take this medicine to prevent heart attacks or blood clotting if prescribed by your doctor or health care professional. Do not take aspirin or aspirin-like medicines with this medicine. Too much aspirin can be dangerous. Always read the labels carefully. This medicine can irritate your stomach or cause bleeding problems. Do not smoke cigarettes or drink alcohol while taking this medicine. Do not lie down for 30 minutes after taking this medicine to prevent irritation to your throat. If you are scheduled for any medical or  dental procedure, tell your healthcare provider that you are taking this medicine. You may need to stop taking this medicine before the procedure. This medicine may be used to treat migraines. If you take migraine medicines for 10 or more days a month, your migraines may get worse. Keep a diary of headache days and medicine use. Contact your healthcare professional if your migraine attacks occur more frequently. What side effects may I notice from receiving this medicine? Side effects that you should report to your doctor or health care professional as soon as possible: -allergic reactions like skin rash, itching or hives, swelling of the face, lips, or tongue -breathing problems -changes in hearing, ringing in the ears -confusion -general ill feeling or flu-like symptoms -pain on swallowing -redness, blistering, peeling or loosening of the skin, including inside the mouth or nose -signs and symptoms of bleeding such as bloody or black, tarry stools; red or dark-brown urine; spitting up blood or brown material that looks like coffee grounds; red spots on the skin; unusual bruising or bleeding from the eye, gums, or nose -trouble passing urine or change in the amount of urine -unusually weak or tired -yellowing of the eyes or skin Side effects that usually do not require medical attention (report to your doctor or health care professional if they continue or are bothersome): -diarrhea or constipation -headache -nausea, vomiting -stomach gas, heartburn This list may not describe all possible side effects. Call your doctor for medical advice about side effects. You may report side effects to FDA at 1-800-FDA-1088. Where should I keep my medicine? Keep out of the reach of children. Store at room temperature between 15 and 30 degrees C (59 and 86 degrees F). Protect from heat and moisture. Do not use this medicine if it has a strong vinegar smell. Throw away any unused medicine after the expiration  date. NOTE: This sheet is a summary. It may not cover all possible information. If you have questions about this medicine, talk to your doctor, pharmacist, or health care provider.  2018 Elsevier/Gold Standard (2013-08-02 11:29:44)

## 2018-10-19 NOTE — Progress Notes (Signed)
Cardiology Office Note:    Date:  10/19/2018   ID:  Catherine Morgan, DOB [birth year], MRN [id number]  PCP:  Cameron Proud, NP  Cardiologist:  Garwin Brothers, MD   Referring MD: Cameron Proud, NP    ASSESSMENT:    1. Essential hypertension, benign   2. Class 2 severe obesity due to excess calories with serious comorbidity in adult, unspecified BMI (HCC)   3. Angina pectoris (HCC)    PLAN:    In order of problems listed above:  1. Primary prevention stressed with the patient.  Importance of compliance with diet and medication stressed and she vocalized understanding.  Diet was explained for dyslipidemia and obesity and she vocalized understanding and plans to lose weight. 2. Sublingual nitroglycerin prescription was sent, its protocol and 911 protocol explained and the patient vocalized understanding questions were answered to the patient's satisfaction she was also advised to take aspirin coated 81 mg on a daily basis. 3. In view of her symptoms she will undergo exercise stress Cardiolite.  We will assess the results and this will aid in her management.  She knows to go to the nearest emergency room for any concerning symptoms. 4. Patient will be seen in follow-up appointment in 3 months or earlier if the patient has any concerns    Medication Adjustments/Labs and Tests Ordered: Current medicines are reviewed at length with the patient today.  Concerns regarding medicines are outlined above.  No orders of the defined types were placed in this encounter.  No orders of the defined types were placed in this encounter.    History of Present Illness:    Catherine Morgan is a 67 y.o. female who is being seen today for the evaluation of chest tightness on exertion going to the jaw at the request of Cameron Proud, NP.  Patient is a pleasant 67 year old female.  She has past medical history of essential hypertension and dyslipidemia.  She tells me that she has history of sleep apnea.   She is morbidly obese and leads a sedentary lifestyle.  She mentions to me that she is under significant stress in the past several months upon loss of her nephew in an accident.  When she exerts she has such substernal chest tightness.  This goes to the neck.  No radiation to the arm or any other part of the body.  It relieves with rest.  She has never used nitroglycerin.  She was using aspirin temporarily but quit because of bruising.  At the time of my evaluation, the patient is alert awake oriented and in no distress.  Past Medical History:  Diagnosis Date  . Anxiety   . Depression   . Fibromyalgia   . GERD (gastroesophageal reflux disease)   . Hyperlipidemia   . Hypertension   . OSA (obstructive sleep apnea)    uses CPAP nightly    Past Surgical History:  Procedure Laterality Date  . ABDOMINAL HYSTERECTOMY    . CARPAL TUNNEL RELEASE    . FRACTURE SURGERY     lt elbow  . MASS EXCISION Right 06/26/2015   Procedure: EXCISION MASS RIGHT SMALL FINGER;  Surgeon: Cindee Salt, MD;  Location: [facility];  Service: Orthopedics;  Laterality: Right;  REGIONAL/FAB  . REDUCTION MAMMAPLASTY      Current Medications: Current Meds  Medication Sig  . Calcium Citrate-Vitamin D (CITRACAL + D PO) Take 1 capsule by mouth daily.   . citalopram (CELEXA) 40 MG tablet  Take 40 mg by mouth daily.   . diazepam (VALIUM) 5 MG tablet Take as directed prior to procedure.  . escitalopram (LEXAPRO) 10 MG tablet Take 10 mg by mouth daily.  Marland Kitchen gabapentin (NEURONTIN) 600 MG tablet Take 600 mg by mouth 2 (two) times daily.   . Glucosamine-Chondroitin (GLUCOSAMINE CHONDR COMPLEX PO) Take 1 capsule by mouth daily.   Marland Kitchen lisinopril-hydrochlorothiazide (PRINZIDE,ZESTORETIC) 10-12.5 MG per tablet Take by mouth daily.   . meloxicam (MOBIC) 15 MG tablet Take 15 mg by mouth daily.   . metoprolol succinate (TOPROL-XL) 50 MG 24 hr tablet Take 50 mg by mouth daily.   Marland Kitchen MICRONIZED COLESTIPOL HCL 1 G tablet Take 1 g  by mouth daily.   . Multiple Vitamins-Minerals (MULTIVITAMIN PO) Take by mouth.  Marland Kitchen NEXIUM 40 MG capsule Take 40 mg by mouth daily at 12 noon.   . Omega-3 Krill Oil 1000 MG CAPS Take 1 capsule by mouth daily.      Allergies:   Oxycodone and Penicillins   Social History   Socioeconomic History  . Marital status: Widowed    Spouse name: Not on file  . Number of children: Not on file  . Years of education: Not on file  . Highest education level: Not on file  Occupational History  . Not on file  Social Needs  . Financial resource strain: Not on file  . Food insecurity:    Worry: Not on file    Inability: Not on file  . Transportation needs:    Medical: Not on file    Non-medical: Not on file  Tobacco Use  . Smoking status: Never Smoker  . Smokeless tobacco: Never Used  Substance and Sexual Activity  . Alcohol use: Yes    Comment: social  . Drug use: No  . Sexual activity: Not on file  Lifestyle  . Physical activity:    Days per week: Not on file    Minutes per session: Not on file  . Stress: Not on file  Relationships  . Social connections:    Talks on phone: Not on file    Gets together: Not on file    Attends religious service: Not on file    Active member of club or organization: Not on file    Attends meetings of clubs or organizations: Not on file    Relationship status: Not on file  Other Topics Concern  . Not on file  Social History Narrative  . Not on file     Family History: The patient's family history includes Diabetes in her father; Heart disease in her father and mother.  ROS:   Please see the history of present illness.    All other systems reviewed and are negative.  EKGs/Labs/Other Studies Reviewed:    The following studies were reviewed today: EKG reveals sinus rhythm and Q waves in inferior leads suggesting the possibility of old inferior wall myocardial infarction.   Recent Labs: No results found for requested labs within last 8760  hours.  Recent Lipid Panel No results found for: CHOL, TRIG, HDL, CHOLHDL, VLDL, LDLCALC, LDLDIRECT  Physical Exam:    VS:  BP 118/62 (BP Location: Right Arm, Patient Position: Sitting, Cuff Size: Normal)   Pulse 73   Ht 4\' 11"  (1.499 m)   Wt 161 lb (73 kg)   SpO2 98%   BMI 32.52 kg/m     Wt Readings from Last 3 Encounters:  10/19/18 161 lb (73 kg)  09/28/18 159 lb (  72.1 kg)  09/03/18 159 lb (72.1 kg)     GEN: Patient is in no acute distress HEENT: Normal NECK: No JVD; No carotid bruits LYMPHATICS: No lymphadenopathy CARDIAC: S1 S2 regular, 2/6 systolic murmur at the apex. RESPIRATORY:  Clear to auscultation without rales, wheezing or rhonchi  ABDOMEN: Soft, non-tender, non-distended MUSCULOSKELETAL:  No edema; No deformity  SKIN: Warm and dry NEUROLOGIC:  Alert and oriented x 3 PSYCHIATRIC:  Normal affect    Signed, Garwin Brothers, MD  10/19/2018 10:25 AM    [facility]

## 2018-11-04 ENCOUNTER — Telehealth (HOSPITAL_COMMUNITY): Payer: Self-pay | Admitting: *Deleted

## 2018-11-04 NOTE — Telephone Encounter (Signed)
Patient given detailed instructions per Myocardial Perfusion Study Information Sheet for the test on 11/26 at 11:15. Patient notified to arrive 15 minutes early and that it is imperative to arrive on time for appointment to keep from having the test rescheduled.  If you need to cancel or reschedule your appointment, please call the office within 24 hours of your appointment. . Patient verbalized understanding.Daneil DolinSharon S Brooks

## 2019-01-12 ENCOUNTER — Telehealth (HOSPITAL_COMMUNITY): Payer: Self-pay | Admitting: *Deleted

## 2019-01-12 NOTE — Telephone Encounter (Signed)
Patient given detailed instructions per Myocardial Perfusion Study Information Sheet for the test on 01/18/19 at 0800. Patient notified to arrive 15 minutes early and that it is imperative to arrive on time for appointment to keep from having the test rescheduled.  If you need to cancel or reschedule your appointment, please call the office within 24 hours of your appointment. . Patient verbalized understanding.Catherine Morgan, Catherine Morgan

## 2019-01-18 ENCOUNTER — Ambulatory Visit (INDEPENDENT_AMBULATORY_CARE_PROVIDER_SITE_OTHER): Payer: Medicare Other

## 2019-01-18 VITALS — Ht 59.0 in | Wt 161.0 lb

## 2019-01-18 DIAGNOSIS — I10 Essential (primary) hypertension: Secondary | ICD-10-CM | POA: Diagnosis not present

## 2019-01-18 DIAGNOSIS — I209 Angina pectoris, unspecified: Secondary | ICD-10-CM | POA: Diagnosis not present

## 2019-01-18 MED ORDER — REGADENOSON 0.4 MG/5ML IV SOLN
0.4000 mg | Freq: Once | INTRAVENOUS | Status: AC
  Administered 2019-01-18: 0.4 mg via INTRAVENOUS

## 2019-01-18 MED ORDER — TECHNETIUM TC 99M TETROFOSMIN IV KIT
32.3000 | PACK | Freq: Once | INTRAVENOUS | Status: AC | PRN
  Administered 2019-01-18: 32.3 via INTRAVENOUS

## 2019-01-18 MED ORDER — TECHNETIUM TC 99M TETROFOSMIN IV KIT
10.1000 | PACK | Freq: Once | INTRAVENOUS | Status: AC | PRN
  Administered 2019-01-18: 10.1 via INTRAVENOUS

## 2019-01-19 LAB — MYOCARDIAL PERFUSION IMAGING
CHL CUP NUCLEAR SDS: 5
CHL CUP RESTING HR STRESS: 78 {beats}/min
LVDIAVOL: 51 mL (ref 46–106)
LVSYSVOL: 16 mL
NUC STRESS TID: 1
Peak HR: 93 {beats}/min
SRS: 1
SSS: 6

## 2019-01-20 ENCOUNTER — Telehealth: Payer: Self-pay

## 2019-01-20 NOTE — Telephone Encounter (Signed)
Patient called and notified of test results. 

## 2019-01-20 NOTE — Telephone Encounter (Signed)
-----   Message from Garwin Brothers, MD sent at 01/19/2019  5:25 PM EST ----- The results of the study is unremarkable. Please inform patient. I will discuss in detail at next appointment. Cc  primary care/referring physician Garwin Brothers, MD 01/19/2019 5:24 PM

## 2019-01-24 ENCOUNTER — Ambulatory Visit (INDEPENDENT_AMBULATORY_CARE_PROVIDER_SITE_OTHER): Payer: Medicare Other | Admitting: Cardiology

## 2019-01-24 ENCOUNTER — Encounter: Payer: Self-pay | Admitting: Cardiology

## 2019-01-24 VITALS — BP 120/62 | HR 74 | Ht 59.0 in | Wt 172.0 lb

## 2019-01-24 DIAGNOSIS — G4730 Sleep apnea, unspecified: Secondary | ICD-10-CM | POA: Diagnosis not present

## 2019-01-24 DIAGNOSIS — E663 Overweight: Secondary | ICD-10-CM

## 2019-01-24 DIAGNOSIS — I10 Essential (primary) hypertension: Secondary | ICD-10-CM | POA: Diagnosis not present

## 2019-01-24 DIAGNOSIS — R0789 Other chest pain: Secondary | ICD-10-CM | POA: Diagnosis not present

## 2019-01-24 MED ORDER — NITROGLYCERIN 0.4 MG SL SUBL: 0.4000 mg | SUBLINGUAL_TABLET | SUBLINGUAL | 11 refills | Status: AC | PRN

## 2019-01-24 NOTE — Patient Instructions (Signed)
Medication Instructions:  Your physician recommends that you continue on your current medications as directed. Please refer to the Current Medication list given to you today.  If you need a refill on your cardiac medications before your next appointment, please call your pharmacy.   Lab work: None  If you have labs (blood work) drawn today and your tests are completely normal, you will receive your results only by: Marland Kitchen MyChart Message (if you have MyChart) OR . A paper copy in the mail If you have any lab test that is abnormal or we need to change your treatment, we will call you to review the results.  Testing/Procedures: None  Follow-Up: At [facility], you and your health needs are our priority.  As part of our continuing mission to provide you with exceptional heart care, we have created designated Provider Care Teams.  These Care Teams include your primary Cardiologist (physician) and Advanced Practice Providers (APPs -  Physician Assistants and Nurse Practitioners) who all work together to provide you with the care you need, when you need it. You will need a follow up appointment 3 months.  Please call our office 2 months in advance to schedule this appointment.  You may see No primary care provider on file. or another member of our [employer] Provider Team in [place]: Gypsy Balsam, MD . Norman Herrlich, MD  Any Other Special Instructions Will Be Listed Below (If Applicable).

## 2019-01-24 NOTE — Progress Notes (Signed)
Cardiology Office Note:    Date:  01/24/2019   ID:  Catherine BisYvonne L Keeter, DOB 09/28/1951, MRN [id number]  PCP:  Cameron Proudhyne, Sarah T, NP  Cardiologist:  Garwin Brothersajan R Kahealani Yankovich, MD   Referring MD: Cameron Proudhyne, Sarah T, NP    ASSESSMENT:    1. Essential hypertension, benign   2. Overweight   3. Chest tightness   4. Sleep apnea, unspecified type    PLAN:    In order of problems listed above:  1. Primary prevention stressed with the patient.  Importance of compliance with diet and medication stressed and she vocalized understanding.  Her blood pressure is stable.  Diet was discussed for obesity and risks of obesity explained.  I told her to walk on a regular basis and a graded exercise was recommended in a slow and increasing fashion and she vocalized understanding. 2. Sublingual nitroglycerin prescription was sent, its protocol and 911 protocol explained and the patient vocalized understanding questions were answered to the patient's satisfaction 3. She has sleep apnea and has not been evaluated by a specialist.  She uses a machine that she has been having for at least many many years.  We will give her an appointment with the sleep specialist to assess these issues per the patient's request. 4. Patient will be seen in follow-up appointment in 6 months or earlier if the patient has any concerns    Medication Adjustments/Labs and Tests Ordered: Current medicines are reviewed at length with the patient today.  Concerns regarding medicines are outlined above.  No orders of the defined types were placed in this encounter.  No orders of the defined types were placed in this encounter.    No chief complaint on file.    History of Present Illness:    Catherine Morgan is a 68 y.o. female.  Patient was evaluated by me for chest tightness.  Her stress test is unremarked well.  Subsequently she is done tells me that she occasionally has chest tightness.  She leads a sedentary lifestyle very compliant with dietary  instructions and has gained weight.  At the time of my evaluation, the patient is alert awake oriented and in no distress.  Past Medical History:  Diagnosis Date  . Anxiety   . Depression   . Fibromyalgia   . GERD (gastroesophageal reflux disease)   . Hyperlipidemia   . Hypertension   . OSA (obstructive sleep apnea)    uses CPAP nightly    Past Surgical History:  Procedure Laterality Date  . ABDOMINAL HYSTERECTOMY    . CARPAL TUNNEL RELEASE    . FRACTURE SURGERY     lt elbow  . MASS EXCISION Right 06/26/2015   Procedure: EXCISION MASS RIGHT SMALL FINGER;  Surgeon: Cindee SaltGary Kuzma, MD;  Location: [facility];  Service: Orthopedics;  Laterality: Right;  REGIONAL/FAB  . REDUCTION MAMMAPLASTY      Current Medications: Current Meds  Medication Sig  . aspirin EC 81 MG tablet Take 1 tablet (81 mg total) by mouth daily.  . Calcium Citrate-Vitamin D (CITRACAL + D PO) Take 1 capsule by mouth daily.   . citalopram (CELEXA) 40 MG tablet Take 40 mg by mouth daily.   . diazepam (VALIUM) 5 MG tablet Take as directed prior to procedure.  . escitalopram (LEXAPRO) 10 MG tablet Take 10 mg by mouth daily.  Marland Kitchen. gabapentin (NEURONTIN) 600 MG tablet Take 600 mg by mouth 2 (two) times daily.   . Glucosamine-Chondroitin (GLUCOSAMINE CHONDR COMPLEX PO) Take 1  capsule by mouth daily.   Marland Kitchen lisinopril-hydrochlorothiazide (PRINZIDE,ZESTORETIC) 10-12.5 MG per tablet Take 1 tablet by mouth daily.   . meloxicam (MOBIC) 15 MG tablet Take 15 mg by mouth daily.   . metoprolol succinate (TOPROL-XL) 50 MG 24 hr tablet Take 50 mg by mouth daily.   Marland Kitchen MICRONIZED COLESTIPOL HCL 1 G tablet Take 1 g by mouth daily.   . Multiple Vitamins-Minerals (MULTIVITAMIN PO) Take by mouth.  Marland Kitchen NEXIUM 40 MG capsule Take 40 mg by mouth daily at 12 noon.   . Omega-3 Krill Oil 1000 MG CAPS Take 1 capsule by mouth daily.      Allergies:   Oxycodone and Penicillins   Social History   Socioeconomic History  . Marital status:  Widowed    Spouse name: Not on file  . Number of children: Not on file  . Years of education: Not on file  . Highest education level: Not on file  Occupational History  . Not on file  Social Needs  . Financial resource strain: Not on file  . Food insecurity:    Worry: Not on file    Inability: Not on file  . Transportation needs:    Medical: Not on file    Non-medical: Not on file  Tobacco Use  . Smoking status: Never Smoker  . Smokeless tobacco: Never Used  Substance and Sexual Activity  . Alcohol use: Yes    Comment: social  . Drug use: No  . Sexual activity: Not on file  Lifestyle  . Physical activity:    Days per week: Not on file    Minutes per session: Not on file  . Stress: Not on file  Relationships  . Social connections:    Talks on phone: Not on file    Gets together: Not on file    Attends religious service: Not on file    Active member of club or organization: Not on file    Attends meetings of clubs or organizations: Not on file    Relationship status: Not on file  Other Topics Concern  . Not on file  Social History Narrative  . Not on file     Family History: The patient's family history includes Diabetes in her father; Heart disease in her father and mother.  ROS:   Please see the history of present illness.    All other systems reviewed and are negative.  EKGs/Labs/Other Studies Reviewed:    The following studies were reviewed today: I discussed my findings with the patient at extensive length.   Recent Labs: No results found for requested labs within last 8760 hours.  Recent Lipid Panel No results found for: CHOL, TRIG, HDL, CHOLHDL, VLDL, LDLCALC, LDLDIRECT  Physical Exam:    VS:  BP 120/62 (BP Location: Right Arm, Patient Position: Sitting, Cuff Size: Normal)   Pulse 74   Ht 4\' 11"  (1.499 m)   Wt 172 lb (78 kg)   SpO2 98%   BMI 34.74 kg/m     Wt Readings from Last 3 Encounters:  01/24/19 172 lb (78 kg)  01/18/19 161 lb (73 kg)   10/19/18 161 lb (73 kg)     GEN: Patient is in no acute distress HEENT: Normal NECK: No JVD; No carotid bruits LYMPHATICS: No lymphadenopathy CARDIAC: Hear sounds regular, 2/6 systolic murmur at the apex. RESPIRATORY:  Clear to auscultation without rales, wheezing or rhonchi  ABDOMEN: Soft, non-tender, non-distended MUSCULOSKELETAL:  No edema; No deformity  SKIN: Warm and dry  NEUROLOGIC:  Alert and oriented x 3 PSYCHIATRIC:  Normal affect   Signed, Garwin Brothers, MD  01/24/2019 11:19 AM    [facility]

## 2019-03-30 ENCOUNTER — Other Ambulatory Visit: Payer: Self-pay

## 2019-03-30 ENCOUNTER — Encounter: Payer: Self-pay | Admitting: Cardiology

## 2019-03-30 ENCOUNTER — Telehealth (INDEPENDENT_AMBULATORY_CARE_PROVIDER_SITE_OTHER): Payer: Medicare Other | Admitting: Cardiology

## 2019-03-30 ENCOUNTER — Telehealth: Payer: Self-pay | Admitting: Cardiology

## 2019-03-30 VITALS — BP 123/73 | HR 69 | Ht 59.0 in | Wt 167.0 lb

## 2019-03-30 DIAGNOSIS — E663 Overweight: Secondary | ICD-10-CM

## 2019-03-30 DIAGNOSIS — I10 Essential (primary) hypertension: Secondary | ICD-10-CM

## 2019-03-30 DIAGNOSIS — G4730 Sleep apnea, unspecified: Secondary | ICD-10-CM

## 2019-03-30 NOTE — Patient Instructions (Signed)
Medication Instructions:  Your physician recommends that you continue on your current medications as directed. Please refer to the Current Medication list given to you today.  If you need a refill on your cardiac medications before your next appointment, please call your pharmacy.   Lab work: None If you have labs (blood work) drawn today and your tests are completely normal, you will receive your results only by: . MyChart Message (if you have MyChart) OR . A paper copy in the mail If you have any lab test that is abnormal or we need to change your treatment, we will call you to review the results.  Testing/Procedures: None  Follow-Up: At [facility], you and your health needs are our priority.  As part of our continuing mission to provide you with exceptional heart care, we have created designated Provider Care Teams.  These Care Teams include your primary Cardiologist (physician) and Advanced Practice Providers (APPs -  Physician Assistants and Nurse Practitioners) who all work together to provide you with the care you need, when you need it. You will need a follow up appointment in 4 months.  Any Other Special Instructions Will Be Listed Below (If Applicable).   

## 2019-03-30 NOTE — Progress Notes (Signed)
Virtual Visit via Video Note   This visit type was conducted due to national recommendations for restrictions regarding the COVID-19 Pandemic (e.g. social distancing) in an effort to limit this patient's exposure and mitigate transmission in our community.  Due to her co-morbid illnesses, this patient is at least at moderate risk for complications without adequate follow up.  This format is felt to be most appropriate for this patient at this time.  All issues noted in this document were discussed and addressed.  A limited physical exam was performed with this format.  Please refer to the patient's chart for her consent to telehealth for [facility].   Evaluation Performed:  Follow-up visit  Date:  03/30/2019   ID:  Catherine Morgan, DOB 11/28/1951, MRN [id number]  Patient Location: Home Provider Location: Home  PCP:  Cameron Proudhyne, Sarah T, NP  Cardiologist:  No primary care provider on file.  Electrophysiologist:  None   Chief Complaint:  Ess hypertension and DOE  History of Present Illness:    Catherine Morgan is a 68 y.o. female with history of shortness of breath, chest tightness and essential hypertension and obesity.  She denies any problems at this time and takes care of activities of daily living.  No chest pain orthopnea or PND.  She leads a sedentary lifestyle.  At the time of my evaluation, the patient is alert awake oriented and in no distress.  The patient does not have symptoms concerning for COVID-19 infection (fever, chills, cough, or new shortness of breath).    Past Medical History:  Diagnosis Date  . Anxiety   . Depression   . Fibromyalgia   . GERD (gastroesophageal reflux disease)   . Hyperlipidemia   . Hypertension   . OSA (obstructive sleep apnea)    uses CPAP nightly   Past Surgical History:  Procedure Laterality Date  . ABDOMINAL HYSTERECTOMY    . CARPAL TUNNEL RELEASE    . FRACTURE SURGERY     lt elbow  . MASS EXCISION Right 06/26/2015   Procedure:  EXCISION MASS RIGHT SMALL FINGER;  Surgeon: Cindee SaltGary Kuzma, MD;  Location: [facility];  Service: Orthopedics;  Laterality: Right;  REGIONAL/FAB  . REDUCTION MAMMAPLASTY       Current Meds  Medication Sig  . aspirin EC 81 MG tablet Take 1 tablet (81 mg total) by mouth daily.  . Calcium Citrate-Vitamin D (CITRACAL + D PO) Take 1 capsule by mouth daily.   . citalopram (CELEXA) 40 MG tablet Take 40 mg by mouth daily.   . diazepam (VALIUM) 5 MG tablet Take as directed prior to procedure.  . escitalopram (LEXAPRO) 10 MG tablet Take 10 mg by mouth daily.  Marland Kitchen. gabapentin (NEURONTIN) 600 MG tablet Take 600 mg by mouth 2 (two) times daily.   . Glucosamine-Chondroitin (GLUCOSAMINE CHONDR COMPLEX PO) Take 1 capsule by mouth daily.   Marland Kitchen. lisinopril-hydrochlorothiazide (PRINZIDE,ZESTORETIC) 10-12.5 MG per tablet Take 1 tablet by mouth daily.   . meloxicam (MOBIC) 15 MG tablet Take 15 mg by mouth daily.   . metoprolol succinate (TOPROL-XL) 50 MG 24 hr tablet Take 50 mg by mouth daily.   Marland Kitchen. MICRONIZED COLESTIPOL HCL 1 G tablet Take 1 g by mouth daily.   . Multiple Vitamins-Minerals (MULTIVITAMIN PO) Take by mouth.  Marland Kitchen. NEXIUM 40 MG capsule Take 40 mg by mouth daily at 12 noon.   . nitroGLYCERIN (NITROSTAT) 0.4 MG SL tablet Place 1 tablet (0.4 mg total) under the tongue every 5 (  five) minutes as needed.  . Omega-3 Krill Oil 1000 MG CAPS Take 1 capsule by mouth daily.      Allergies:   Oxycodone and Penicillins   Social History   Tobacco Use  . Smoking status: Never Smoker  . Smokeless tobacco: Never Used  Substance Use Topics  . Alcohol use: Yes    Comment: social  . Drug use: No     Family Hx: The patient's family history includes Diabetes in her father; Heart disease in her father and mother.  ROS:   Please see the history of present illness.    No chest pain. Orthopnea or PND All other systems reviewed and are negative.   Prior CV studies:   The following studies were reviewed  today:  Stress test report discussed with pt  Labs/Other Tests and Data Reviewed:    EKG:  No ECG reviewed.  Recent Labs: No results found for requested labs within last 8760 hours.   Recent Lipid Panel No results found for: CHOL, TRIG, HDL, CHOLHDL, LDLCALC, LDLDIRECT  Wt Readings from Last 3 Encounters:  03/30/19 167 lb (75.8 kg)  01/24/19 172 lb (78 kg)  01/18/19 161 lb (73 kg)     Objective:    Vital Signs:  BP 123/73 (BP Location: Left Arm, Patient Position: Sitting, Cuff Size: Normal)   Pulse 69   Ht 4\' 11"  (1.499 m)   Wt 167 lb (75.8 kg)   BMI 33.73 kg/m    Well nourished, well developed female in no acute distress. Patient appeared comfortable and cheerful and in no distress  ASSESSMENT & PLAN:    1. Shortness of breath on exertion: This appears to be secondary to her sedentary lifestyle.  I discussed this with her at extensive length and importance of regular exercise stressed and she vocalized understanding. 2. Sleep health issues were discussed and she uses a sleep machine on a regular basis. 3. Her blood pressure is stable 4. Her lipids are followed by her primary care physician. 5. Diet was discussed for obesity and risks of obesity explained and she plans to do better. 6. Patient will be seen in follow-up appointment in 4 months or earlier if the patient has any concerns   COVID-19 Education: The signs and symptoms of COVID-19 were discussed with the patient and how to seek care for testing (follow up with PCP or arrange E-visit).  The importance of social distancing was discussed today.  Time:   Today, I have spent 16 minutes with the patient with telehealth technology discussing the above problems.     Medication Adjustments/Labs and Tests Ordered: Current medicines are reviewed at length with the patient today.  Concerns regarding medicines are outlined above.   Tests Ordered: No orders of the defined types were placed in this encounter.    Medication Changes: No orders of the defined types were placed in this encounter.   Disposition:  Follow up in 4 month(s)  Signed, Garwin Brothers, MD  03/30/2019 11:57 AM    [facility]

## 2019-03-30 NOTE — Telephone Encounter (Signed)
Virtual Visit Pre-Appointment Phone Call  Steps For Call:  1. Confirm consent - "In the setting of the current Covid19 crisis, you are scheduled for a (phone or video) visit with your provider on (date) at (time).  Just as we do with many in-office visits, in order for you to participate in this visit, we must obtain consent.  If you'd like, I can send this to your mychart (if signed up) or email for you to review.  Otherwise, I can obtain your verbal consent now.  All virtual visits are billed to your insurance company just like a normal visit would be.  By agreeing to a virtual visit, we'd like you to understand that the technology does not allow for your provider to perform an examination, and thus may limit your provider's ability to fully assess your condition.  Finally, though the technology is pretty good, we cannot assure that it will always work on either your or our end, and in the setting of a video visit, we may have to convert it to a phone-only visit.  In either situation, we cannot ensure that we have a secure connection.  Are you willing to proceed?" STAFF: Did the patient verbally acknowledge consent to telehealth visit? Document YES/NO here: YES  2. Confirm the BEST phone number to call the day of the visit by including in appointment notes  3. Give patient instructions for WebEx/MyChart download to smartphone as below or Doximity/Doxy.me if video visit (depending on what platform provider is using)  4. Advise patient to be prepared with their blood pressure, heart rate, weight, any heart rhythm information, their current medicines, and a piece of paper and pen handy for any instructions they may receive the day of their visit  5. Inform patient they will receive a phone call 15 minutes prior to their appointment time (may be from unknown caller ID) so they should be prepared to answer  6. Confirm that appointment type is correct in Epic appointment notes (VIDEO vs  PHONE)     TELEPHONE CALL NOTE  ADYLINE GRANDT has been deemed a candidate for a follow-up tele-health visit to limit community exposure during the Covid-19 pandemic. I spoke with the patient via phone to ensure availability of phone/video source, confirm preferred email & phone number, and discuss instructions and expectations.  I reminded Catherine Morgan to be prepared with any vital sign and/or heart rhythm information that could potentially be obtained via home monitoring, at the time of her visit. I reminded Catherine Morgan to expect a phone call at the time of her visit if her visit.  Minette Brine 03/30/2019 11:05 AM   INSTRUCTIONS FOR DOWNLOADING THE WEBEX APP TO SMARTPHONE  - If Apple, ask patient to go to App Store and type in WebEx in the search bar. Download [employer], the blue/green circle. If Android, go to [employer] and type in [employer] in the search bar. The app is free but as with any other app downloads, their phone may require them to verify saved payment information or Apple/Android password.  - The patient does NOT have to create an account. - On the day of the visit, the assist will walk the patient through joining the meeting with the meeting number/password.  INSTRUCTIONS FOR DOWNLOADING THE MYCHART APP TO SMARTPHONE  - The patient must first make sure to have activated MyChart and know their login information - If Apple, go to [employer] and type in  MyChart in the search bar and download the app. If Android, ask patient to go to [employer] and type in [place] in the search bar and download the app. The app is free but as with any other app downloads, their phone may require them to verify saved payment information or Apple/Android password.  - The patient will need to then log into the app with their MyChart username and password, and select [facility] as their healthcare provider to link the account. When it is time for your visit, go to the  MyChart app, find appointments, and click Begin Video Visit. Be sure to Select Allow for your device to access the Microphone and Camera for your visit. You will then be connected, and your provider will be with you shortly.  **If they have any issues connecting, or need assistance please contact MyChart service desk [phone])**  **If using a computer, in order to ensure the best quality for their visit they will need to use either of the following Internet Browsers: [employer], or Google Chrome**  IF USING DOXIMITY or DOXY.ME - The patient will receive a link just prior to their visit, either by text or email (to be determined day of appointment depending on if it's doxy.me or Doximity).     FULL LENGTH CONSENT FOR TELE-HEALTH VISIT   I hereby voluntarily request, consent and authorize [facility] and its employed or contracted physicians, physician assistants, nurse practitioners or other licensed health care professionals (the Practitioner), to provide me with telemedicine health care services (the Services") as deemed necessary by the treating Practitioner. I acknowledge and consent to receive the Services by the Practitioner via telemedicine. I understand that the telemedicine visit will involve communicating with the Practitioner through live audiovisual communication technology and the disclosure of certain medical information by electronic transmission. I acknowledge that I have been given the opportunity to request an in-person assessment or other available alternative prior to the telemedicine visit and am voluntarily participating in the telemedicine visit.  I understand that I have the right to withhold or withdraw my consent to the use of telemedicine in the course of my care at any time, without affecting my right to future care or treatment, and that the Practitioner or I may terminate the telemedicine visit at any time. I understand that I have the right to  inspect all information obtained and/or recorded in the course of the telemedicine visit and may receive copies of available information for a reasonable fee.  I understand that some of the potential risks of receiving the Services via telemedicine include:   Delay or interruption in medical evaluation due to technological equipment failure or disruption;  Information transmitted may not be sufficient (e.g. poor resolution of images) to allow for appropriate medical decision making by the Practitioner; and/or   In rare instances, security protocols could fail, causing a breach of personal health information.  Furthermore, I acknowledge that it is my responsibility to provide information about my medical history, conditions and care that is complete and accurate to the best of my ability. I acknowledge that Practitioner's advice, recommendations, and/or decision may be based on factors not within their control, such as incomplete or inaccurate data provided by me or distortions of diagnostic images or specimens that may result from electronic transmissions. I understand that the practice of medicine is not an exact science and that Practitioner makes no warranties or guarantees regarding treatment outcomes. I acknowledge that I will receive a copy  of this consent concurrently upon execution via email to the email address I last provided but may also request a printed copy by calling the office of [facility].    I understand that my insurance will be billed for this visit.   I have read or had this consent read to me.  I understand the contents of this consent, which adequately explains the benefits and risks of the Services being provided via telemedicine.   I have been provided ample opportunity to ask questions regarding this consent and the Services and have had my questions answered to my satisfaction.  I give my informed consent for the services to be provided through the use of  telemedicine in my medical care  By participating in this telemedicine visit I agree to the above.

## 2019-05-02 ENCOUNTER — Ambulatory Visit: Payer: Medicare Other | Admitting: Cardiology

## 2019-06-30 ENCOUNTER — Other Ambulatory Visit: Payer: Self-pay

## 2019-06-30 ENCOUNTER — Encounter: Payer: Self-pay | Admitting: Cardiology

## 2019-06-30 ENCOUNTER — Telehealth (INDEPENDENT_AMBULATORY_CARE_PROVIDER_SITE_OTHER): Payer: Medicare Other | Admitting: Cardiology

## 2019-06-30 VITALS — BP 133/71 | HR 76 | Ht 59.0 in | Wt 160.0 lb

## 2019-06-30 DIAGNOSIS — I10 Essential (primary) hypertension: Secondary | ICD-10-CM

## 2019-06-30 NOTE — Patient Instructions (Signed)

## 2019-06-30 NOTE — Progress Notes (Signed)
Virtual Visit via Telephone Note   This visit type was conducted due to national recommendations for restrictions regarding the COVID-19 Pandemic (e.g. social distancing) in an effort to limit this patient's exposure and mitigate transmission in our community.  Due to her co-morbid illnesses, this patient is at least at moderate risk for complications without adequate follow up.  This format is felt to be most appropriate for this patient at this time.  The patient did not have access to video technology/had technical difficulties with video requiring transitioning to audio format only (telephone).  All issues noted in this document were discussed and addressed.  No physical exam could be performed with this format.  Please refer to the patient's chart for her  consent to [facility]health [facility].   Date:  06/30/2019   ID:  Catherine Morgan, DOB 06/19/1951, MRN [id number]  Patient Location: Home Provider Location: Office  PCP:  Cameron Proudhyne, Sarah T, NP  Cardiologist:  No primary care provider on file.  Electrophysiologist:  None   Evaluation Performed:  Follow-Up Visit  Chief Complaint: Follow-up for chest discomfort evaluation  History of Present Illness:    Catherine Morgan is a 68 y.o. female with past medical history of essential hypertension and dyslipidemia.  She was evaluated earlier this year for chest discomfort symptoms and a stress test was negative.  Subsequently she is done fine.  No chest pain orthopnea or PND.  She saw her primary care recently and was initiated on rosuvastatin on a daily basis for dyslipidemia.  At the time of my evaluation, the patient is alert awake oriented and in no distress.  The patient does not have symptoms concerning for COVID-19 infection (fever, chills, cough, or new shortness of breath).    Past Medical History:  Diagnosis Date  . Anxiety   . Depression   . Fibromyalgia   . GERD (gastroesophageal reflux disease)   . Hyperlipidemia   .  Hypertension   . OSA (obstructive sleep apnea)    uses CPAP nightly   Past Surgical History:  Procedure Laterality Date  . ABDOMINAL HYSTERECTOMY    . CARPAL TUNNEL RELEASE    . FRACTURE SURGERY     lt elbow  . MASS EXCISION Right 06/26/2015   Procedure: EXCISION MASS RIGHT SMALL FINGER;  Surgeon: Cindee SaltGary Kuzma, MD;  Location: [facility];  Service: Orthopedics;  Laterality: Right;  REGIONAL/FAB  . REDUCTION MAMMAPLASTY       No outpatient medications have been marked as taking for the 06/30/19 encounter (Telemedicine) with Revankar, Aundra Dubinajan R, MD.     Allergies:   Oxycodone and Penicillins   Social History   Tobacco Use  . Smoking status: Never Smoker  . Smokeless tobacco: Never Used  Substance Use Topics  . Alcohol use: Yes    Comment: social  . Drug use: No     Family Hx: The patient's family history includes Diabetes in her father; Heart disease in her father and mother.  ROS:   Please see the history of present illness.    As mentioned above All other systems reviewed and are negative.   Prior CV studies:   The following studies were reviewed today:  Stress test report was reviewed with patient it was normal with normal ejection fraction  Labs/Other Tests and Data Reviewed:    EKG:  No ECG reviewed.  Recent Labs: No results found for requested labs within last 8760 hours.   Recent Lipid Panel No results found  for: CHOL, TRIG, HDL, CHOLHDL, LDLCALC, LDLDIRECT  Wt Readings from Last 3 Encounters:  06/30/19 160 lb (72.6 kg)  03/30/19 167 lb (75.8 kg)  01/24/19 172 lb (78 kg)     Objective:    Vital Signs:  BP 133/71 (BP Location: Left Arm, Patient Position: Sitting, Cuff Size: Normal)   Pulse 76   Ht 4\' 11"  (1.499 m)   Wt 160 lb (72.6 kg)   BMI 32.32 kg/m    VITAL SIGNS:  reviewed  ASSESSMENT & PLAN:    1. Chest discomfort: The symptoms have resolved.  She is now active and denies any symptoms on activity. 2. Essential hypertension:  Blood pressure is normal 3. Mixed dyslipidemia: Diet was discussed.  Benefits and risks of statins explained and she vocalized understanding 4. Patient will be seen in follow-up appointment in 6 months or earlier if the patient has any concerns   COVID-19 Education: The signs and symptoms of COVID-19 were discussed with the patient and how to seek care for testing (follow up with PCP or arrange E-visit).  The importance of social distancing was discussed today.  Time:   Today, I have spent 12 minutes with the patient with telehealth technology discussing the above problems.     Medication Adjustments/Labs and Tests Ordered: Current medicines are reviewed at length with the patient today.  Concerns regarding medicines are outlined above.   Tests Ordered: No orders of the defined types were placed in this encounter.   Medication Changes: No orders of the defined types were placed in this encounter.   Follow Up:  Virtual Visit or In Person in 4 month(s)  Signed, Jenean Lindau, MD  06/30/2019 11:06 AM    [place]

## 2019-10-04 IMAGING — MR MR CERVICAL SPINE W/O CM
4 of 5 series · 28 of 48 positions shown · non-contrast
Comparison: Cervical radiographs 02/09/2018, MRI 08/25/2011

CLINICAL DATA: Cervical spondylosis without myelopathy. Cervical
fusion

EXAM:
MRI CERVICAL SPINE WITHOUT CONTRAST
TECHNIQUE: Multiplanar, multisequence MR imaging of the cervical spine was
performed. No intravenous contrast was administered.

[Series 4: T2 · sagittal · 3.0mm · 0.66mm/px · 6 of 15 slices shown (1 of 2)]
[im 1/15]
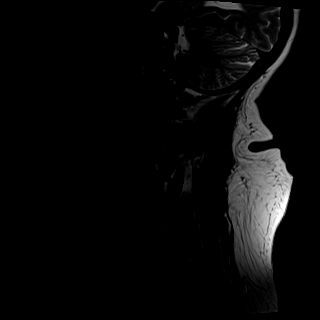
[im 3/15]
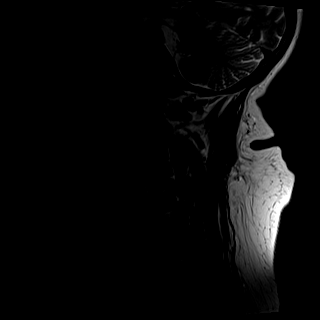
[im 6/15]
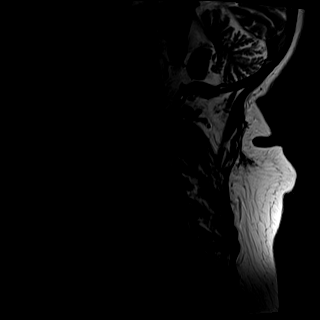
[im 9/15]
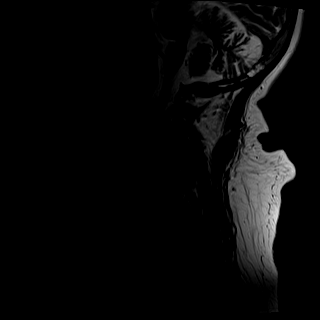
[im 12/15]
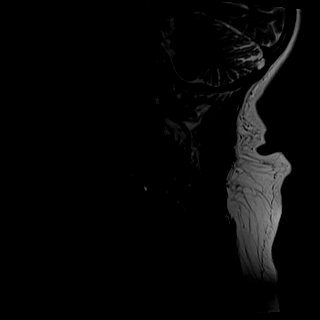
[im 15/15]
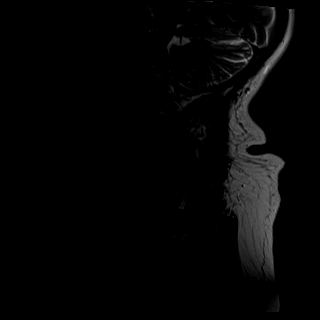

[Series 5: T1 · sagittal · 3.0mm · 0.41mm/px · 7 of 15 slices shown]
[im 1/15]
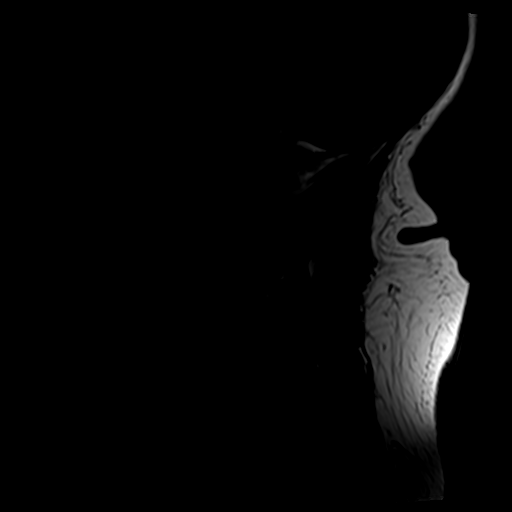
[im 3/15]
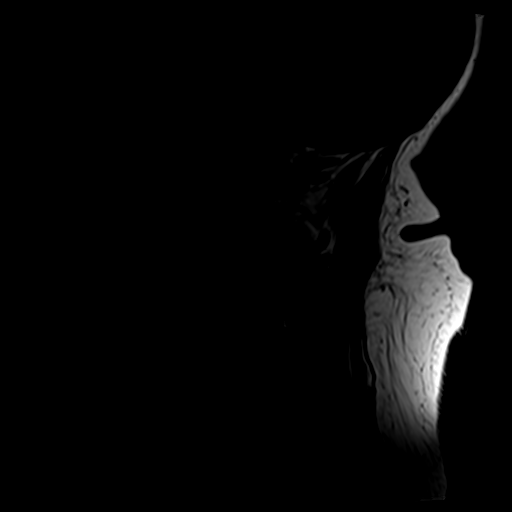
[im 5/15]
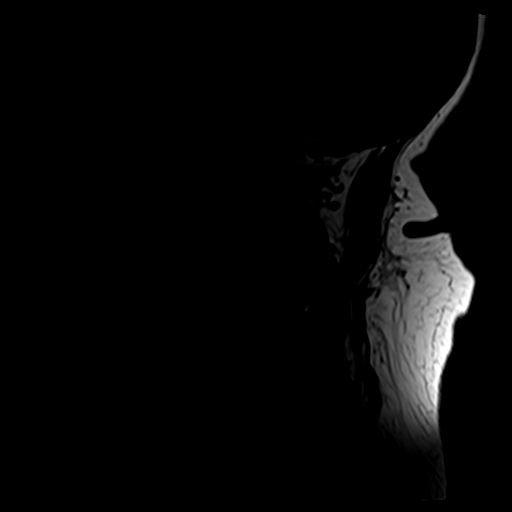
[im 8/15]
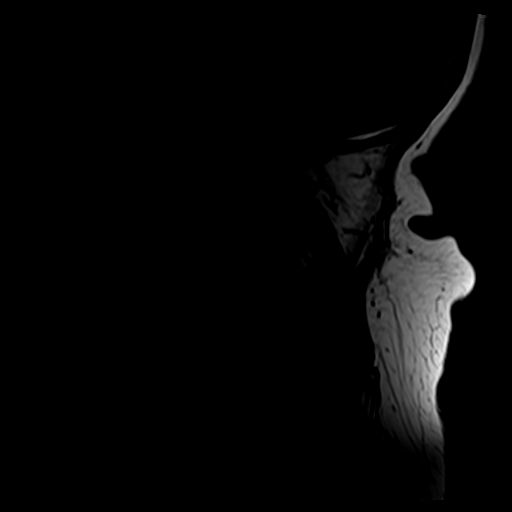
[im 10/15]
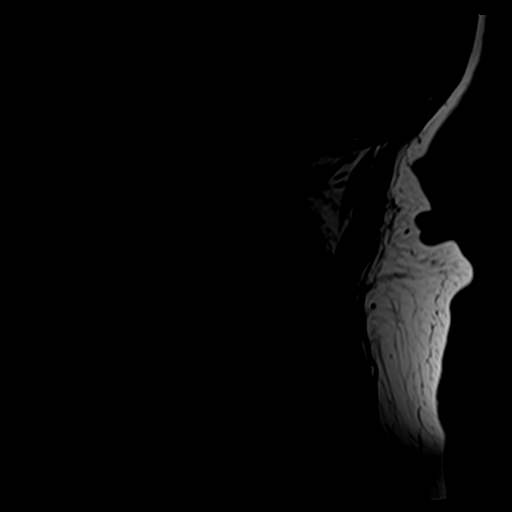
[im 12/15]
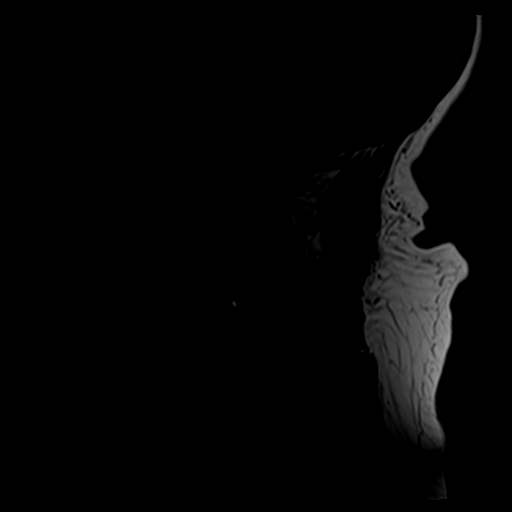
[im 15/15]
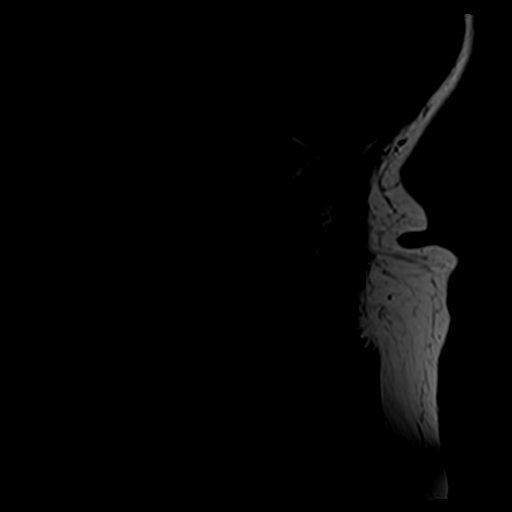

[Series 6: tir sag · sagittal · 3.0mm · 0.41mm/px · 7 of 15 slices shown]
[im 1/15]
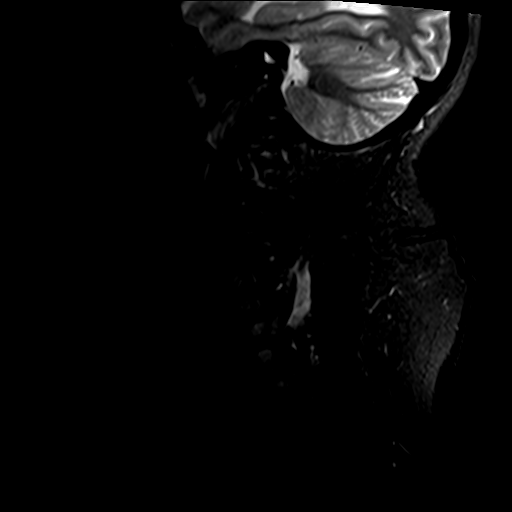
[im 3/15]
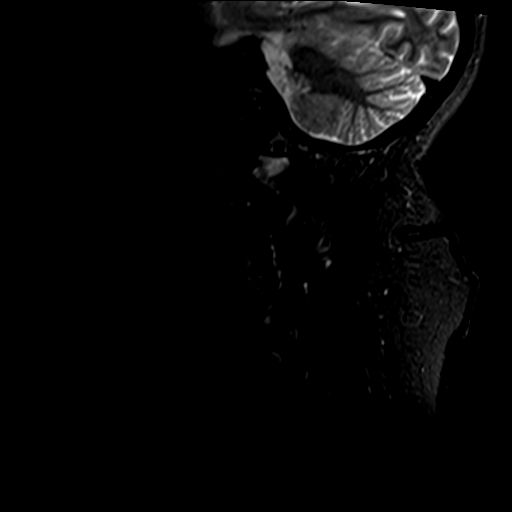
[im 5/15]
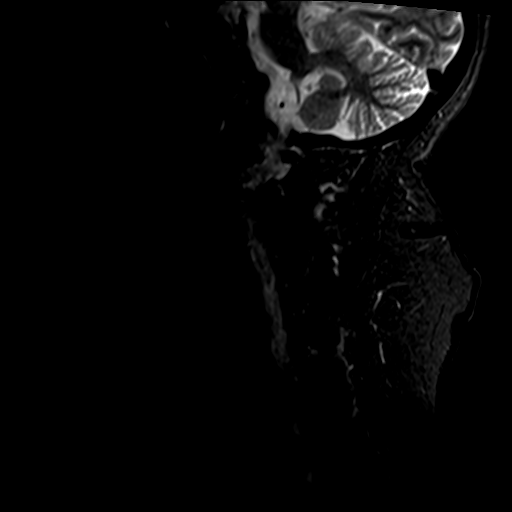
[im 8/15]
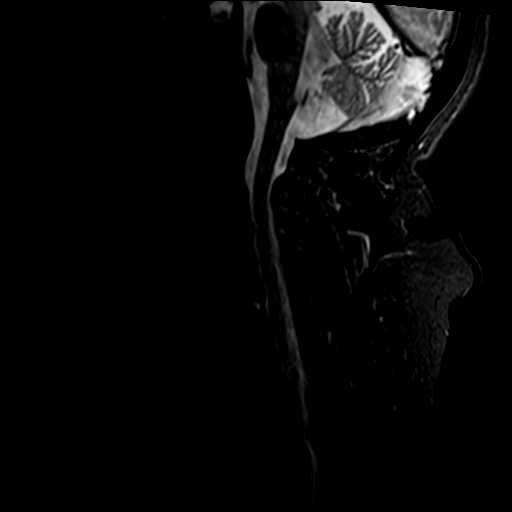
[im 10/15]
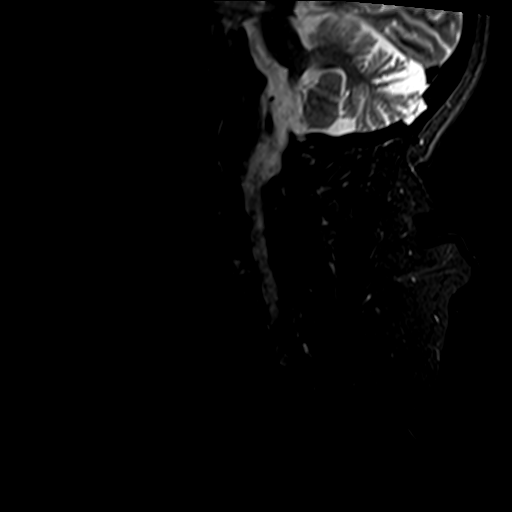
[im 12/15]
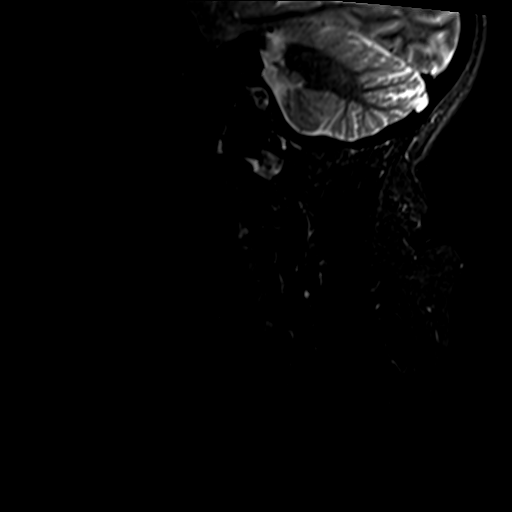
[im 15/15]
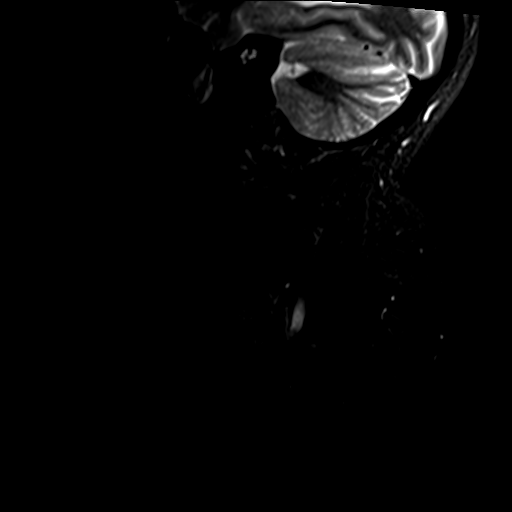

[Series 8: T2 · axial · 3.0mm · 0.70mm/px · z∈[-112,-12]mm · 8 of 29 slices shown (2 of 2)]
[im 1/29]
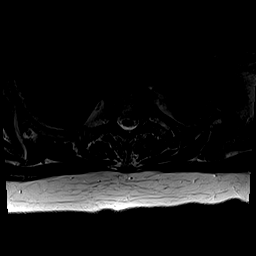
[im 5/29]
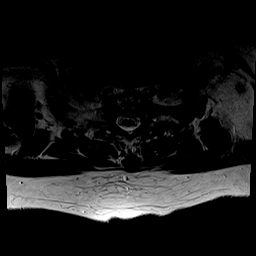
[im 9/29]
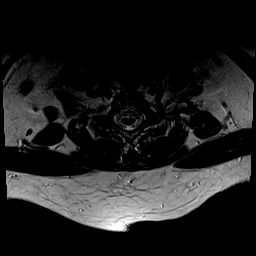
[im 13/29]
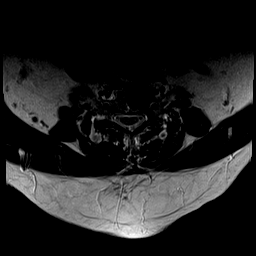
[im 16/29]
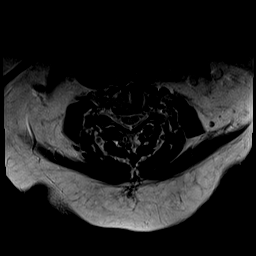
[im 20/29]
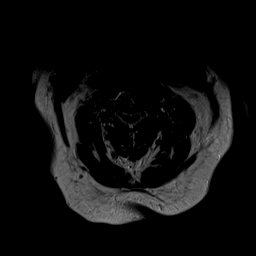
[im 24/29]
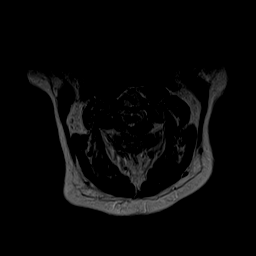
[im 29/29]
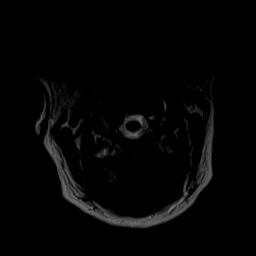

[28 of 48 positions shown; findings below may reference images not displayed]

FINDINGS: Alignment: Mild anterolisthesis C7-T1.  Remaining alignment normal.

Vertebrae: ACDF C4-5 and C5-6.  Negative for fracture or mass

Cord: Normal spinal cord signal.

Posterior Fossa, vertebral arteries, paraspinal tissues: Negative

Disc levels:

C2-3: Small central disc protrusion and mild spurring. Mild right
foraminal narrowing due to facet hypertrophy and uncinate spurring.

C3-4: Mild disc degeneration and spurring. Left-sided facet
hypertrophy. Mild left foraminal stenosis

C4-5: ACDF with solid fusion.  Negative for stenosis

C5-6: ACDF with solid fusion.  Negative for stenosis.

C6-7: Moderate to advanced disc degeneration. Diffuse uncinate
spurring. Mild foraminal narrowing bilaterally due to spurring.

C7-T1: Mild anterolisthesis. Mild facet degeneration. No significant
stenosis

T1-2: Advanced disc degeneration with prominent spurring. Mild
spinal stenosis and moderate foraminal stenosis bilaterally.
IMPRESSION: ACDF with solid fusion at C4-5 and C5-6

Mild left foraminal stenosis at C3-4. Mild right foraminal narrowing
C2-3.

Mild foraminal narrowing bilaterally at C6-7.

Mild spinal stenosis and moderate foraminal stenosis bilaterally at
T1-2 secondary to spurring.

## 2020-01-27 ENCOUNTER — Encounter: Payer: Self-pay | Admitting: Cardiology

## 2020-01-27 ENCOUNTER — Other Ambulatory Visit: Payer: Self-pay

## 2020-01-27 ENCOUNTER — Ambulatory Visit (INDEPENDENT_AMBULATORY_CARE_PROVIDER_SITE_OTHER): Payer: Medicare Other | Admitting: Cardiology

## 2020-01-27 VITALS — BP 122/76 | HR 72 | Temp 97.9°F | Wt 170.2 lb

## 2020-01-27 DIAGNOSIS — I10 Essential (primary) hypertension: Secondary | ICD-10-CM

## 2020-01-27 DIAGNOSIS — E663 Overweight: Secondary | ICD-10-CM | POA: Diagnosis not present

## 2020-01-27 DIAGNOSIS — E782 Mixed hyperlipidemia: Secondary | ICD-10-CM

## 2020-01-27 DIAGNOSIS — G4730 Sleep apnea, unspecified: Secondary | ICD-10-CM | POA: Diagnosis not present

## 2020-01-27 NOTE — Patient Instructions (Signed)
Medication Instructions:  No medication changes *If you need a refill on your cardiac medications before your next appointment, please call your pharmacy*  Lab Work: None ordered If you have labs (blood work) drawn today and your tests are completely normal, you will receive your results only by: . MyChart Message (if you have MyChart) OR . A paper copy in the mail If you have any lab test that is abnormal or we need to change your treatment, we will call you to review the results.  Testing/Procedures: None ordered  Follow-Up: At [facility], you and your health needs are our priority.  As part of our continuing mission to provide you with exceptional heart care, we have created designated Provider Care Teams.  These Care Teams include your primary Cardiologist (physician) and Advanced Practice Providers (APPs -  Physician Assistants and Nurse Practitioners) who all work together to provide you with the care you need, when you need it.  Your next appointment:   1 month(s)  The format for your next appointment:   In Person  Provider:   Rajan Revankar, MD  Other Instructions NA 

## 2020-01-27 NOTE — Progress Notes (Signed)
Cardiology Office Note:    Date:  01/27/2020   ID:  Catherine Morgan, DOB [birth year], MRN [id number]  PCP:  Catherine Handler, NP  Cardiologist:  Catherine Brothers, MD   Referring MD: Catherine Handler, NP    ASSESSMENT:    No diagnosis found. PLAN:    In order of problems listed above:  1. Primary prevention stressed with the patient.  Importance of compliance with diet and medication stressed and she vocalized understanding. 2. Essential hypertension: Blood pressure is stable.  Diet was discussed 3. Mixed dyslipidemia: Her lipids are markedly elevated and I told her to keep a close track on her diet and she promises to do so.  She will be back in 1 month at which time she will get me a log of her walking and weights.  She will also bring lipid and other blood work done by primary care physician and I will advise her accordingly. 4. Obesity: Risks of obesity explained and weight reduction was stressed.  Diet was emphasized.    Medication Adjustments/Labs and Tests Ordered: Current medicines are reviewed at length with the patient today.  Concerns regarding medicines are outlined above.  No orders of the defined types were placed in this encounter.  No orders of the defined types were placed in this encounter.    No chief complaint on file.    History of Present Illness:    Catherine Morgan is a 69 y.o. female.  Patient has past medical history of essential hypertension dyslipidemia and obesity.  She denies any problems at this time and takes care of activities of daily living.  No chest pain orthopnea or PND.  She mentions to me that she has sleep apnea.  Her last lipid check was elevated significantly.  She is going for blood work next week with her primary care physician.  She is on a nonstatin lipid-lowering medication and she does not remember the name of it.  At the time of my evaluation, the patient is alert awake oriented and in no distress.  Past Medical History:  Diagnosis  Date  . Anxiety   . Depression   . Fibromyalgia   . GERD (gastroesophageal reflux disease)   . Hyperlipidemia   . Hypertension   . OSA (obstructive sleep apnea)    uses CPAP nightly    Past Surgical History:  Procedure Laterality Date  . ABDOMINAL HYSTERECTOMY    . CARPAL TUNNEL RELEASE    . FRACTURE SURGERY     lt elbow  . MASS EXCISION Right 06/26/2015   Procedure: EXCISION MASS RIGHT SMALL FINGER;  Surgeon: Cindee Salt, MD;  Location: [facility];  Service: Orthopedics;  Laterality: Right;  REGIONAL/FAB  . REDUCTION MAMMAPLASTY      Current Medications: Current Meds  Medication Sig  . aspirin EC 81 MG tablet Take 1 tablet (81 mg total) by mouth daily.  . Calcium Citrate-Vitamin D (CITRACAL + D PO) Take 1 capsule by mouth daily.   . citalopram (CELEXA) 40 MG tablet Take 40 mg by mouth daily.   Marland Kitchen gabapentin (NEURONTIN) 600 MG tablet Take 600 mg by mouth 2 (two) times daily.   . Glucosamine-Chondroitin (GLUCOSAMINE CHONDR COMPLEX PO) Take 1 capsule by mouth daily.   Marland Kitchen lisinopril-hydrochlorothiazide (PRINZIDE,ZESTORETIC) 10-12.5 MG per tablet Take 1 tablet by mouth daily.   . meloxicam (MOBIC) 15 MG tablet Take 15 mg by mouth daily.   . metoprolol succinate (TOPROL-XL) 50 MG 24 hr tablet Take  50 mg by mouth daily.   . Multiple Vitamins-Minerals (MULTIVITAMIN PO) Take by mouth.  Marland Kitchen NEXIUM 40 MG capsule Take 40 mg by mouth daily at 12 noon.   . Omega-3 Krill Oil 1000 MG CAPS Take 1 capsule by mouth daily.      Allergies:   Oxycodone and Penicillins   Social History   Socioeconomic History  . Marital status: Widowed    Spouse name: Not on file  . Number of children: Not on file  . Years of education: Not on file  . Highest education level: Not on file  Occupational History  . Not on file  Tobacco Use  . Smoking status: Never Smoker  . Smokeless tobacco: Never Used  Substance and Sexual Activity  . Alcohol use: Yes    Comment: social  . Drug use: No  .  Sexual activity: Not on file  Other Topics Concern  . Not on file  Social History Narrative  . Not on file   Social Determinants of Health   Financial Resource Strain:   . Difficulty of Paying Living Expenses: Not on file  Food Insecurity:   . Worried About Charity fundraiser in the Last Year: Not on file  . Ran Out of Food in the Last Year: Not on file  Transportation Needs:   . Lack of Transportation (Medical): Not on file  . Lack of Transportation (Non-Medical): Not on file  Physical Activity:   . Days of Exercise per Week: Not on file  . Minutes of Exercise per Session: Not on file  Stress:   . Feeling of Stress : Not on file  Social Connections:   . Frequency of Communication with Friends and Family: Not on file  . Frequency of Social Gatherings with Friends and Family: Not on file  . Attends Religious Services: Not on file  . Active Member of Clubs or Organizations: Not on file  . Attends Archivist Meetings: Not on file  . Marital Status: Not on file     Family History: The patient's family history includes Diabetes in her father; Heart disease in her father and mother.  ROS:   Please see the history of present illness.    All other systems reviewed and are negative.  EKGs/Labs/Other Studies Reviewed:    The following studies were reviewed today: Study Highlights   The left ventricular ejection fraction is hyperdynamic (>65%).  Nuclear stress EF: 70%.  Blood pressure demonstrated a normal response to exercise.  There was no ST segment deviation noted during stress.  Defect 1: There is a small defect of mild severity present in the apex location.  This is a low risk study.  No evidence of ischemia or MI. Normal EF.        Recent Labs: No results found for requested labs within last 8760 hours.  Recent Lipid Panel No results found for: CHOL, TRIG, HDL, CHOLHDL, VLDL, LDLCALC, LDLDIRECT  Physical Exam:    VS:  BP 122/76   Pulse 72    Temp 97.9 F (36.6 C)   Wt 170 lb 3.2 oz (77.2 kg)   SpO2 98%   BMI 34.38 kg/m     Wt Readings from Last 3 Encounters:  01/27/20 170 lb 3.2 oz (77.2 kg)  06/30/19 160 lb (72.6 kg)  03/30/19 167 lb (75.8 kg)     GEN: Patient is in no acute distress HEENT: Normal NECK: No JVD; No carotid bruits LYMPHATICS: No lymphadenopathy CARDIAC: Hear sounds  regular, 2/6 systolic murmur at the apex. RESPIRATORY:  Clear to auscultation without rales, wheezing or rhonchi  ABDOMEN: Soft, non-tender, non-distended MUSCULOSKELETAL:  No edema; No deformity  SKIN: Warm and dry NEUROLOGIC:  Alert and oriented x 3 PSYCHIATRIC:  Normal affect   Signed, Catherine Brothers, MD  01/27/2020 9:40 AM     [facility]

## 2020-02-22 ENCOUNTER — Other Ambulatory Visit: Payer: Self-pay

## 2020-02-22 ENCOUNTER — Encounter: Payer: Self-pay | Admitting: Cardiology

## 2020-02-22 ENCOUNTER — Ambulatory Visit (INDEPENDENT_AMBULATORY_CARE_PROVIDER_SITE_OTHER): Payer: Medicare Other | Admitting: Cardiology

## 2020-02-22 VITALS — BP 118/68 | HR 68 | Temp 96.0°F | Resp 18 | Ht 60.0 in | Wt 169.5 lb

## 2020-02-22 DIAGNOSIS — E782 Mixed hyperlipidemia: Secondary | ICD-10-CM

## 2020-02-22 DIAGNOSIS — G4730 Sleep apnea, unspecified: Secondary | ICD-10-CM | POA: Diagnosis not present

## 2020-02-22 DIAGNOSIS — I10 Essential (primary) hypertension: Secondary | ICD-10-CM | POA: Diagnosis not present

## 2020-02-22 DIAGNOSIS — E663 Overweight: Secondary | ICD-10-CM

## 2020-02-22 NOTE — Patient Instructions (Signed)

## 2020-02-22 NOTE — Progress Notes (Signed)
Cardiology Office Note:    Date:  02/22/2020   ID:  Catherine Morgan, DOB [birth year], MRN [id number]  PCP:  Julianne Handler, NP  Cardiologist:  Garwin Brothers, MD   Referring MD: Julianne Handler, NP    ASSESSMENT:    1. Essential hypertension, benign   2. Mixed dyslipidemia   3. Overweight   4. Sleep apnea, unspecified type    PLAN:    In order of problems listed above:  1. Primary prevention stressed with the patient.  Importance of compliance with diet and medication stressed and she vocalized understanding.  Diet was discussed for obesity and weight reduction was stressed.  Importance of regular exercise stressed I told her to walk at least half an hour a day on a daily basis at least 5 days a week and she promises to do so.  Dietary interventions were emphasized. 2. Essential hypertension: Blood pressure is stable.  She had blood work done by primary care physician and she was told it was fine. 3. Mixed dyslipidemia obesity: I again discussed diet and exercise and she promises to do better. 4. Sleep apnea: Sleep health issues were discussed with the patient. 5. Patient will be seen in follow-up appointment in 6 months or earlier if the patient has any concerns    Medication Adjustments/Labs and Tests Ordered: Current medicines are reviewed at length with the patient today.  Concerns regarding medicines are outlined above.  No orders of the defined types were placed in this encounter.  No orders of the defined types were placed in this encounter.    Chief Complaint  Patient presents with  . Follow-up    Pt here for 1 month FU. Denies any chest pain.     History of Present Illness:    Catherine Morgan is a 69 y.o. female.  Patient has past medical history of essential hypertension and dyslipidemia.  She is overweight.  She leads a sedentary lifestyle.  She denies any chest pain orthopnea or PND.  At the time of my evaluation, the patient is alert awake oriented and in no  distress.  Past Medical History:  Diagnosis Date  . Anxiety   . Depression   . Fibromyalgia   . GERD (gastroesophageal reflux disease)   . Hyperlipidemia   . Hypertension   . OSA (obstructive sleep apnea)    uses CPAP nightly    Past Surgical History:  Procedure Laterality Date  . ABDOMINAL HYSTERECTOMY    . CARPAL TUNNEL RELEASE    . FRACTURE SURGERY     lt elbow  . MASS EXCISION Right 06/26/2015   Procedure: EXCISION MASS RIGHT SMALL FINGER;  Surgeon: Cindee Salt, MD;  Location: [facility];  Service: Orthopedics;  Laterality: Right;  REGIONAL/FAB  . REDUCTION MAMMAPLASTY      Current Medications: Current Meds  Medication Sig  . aspirin EC 81 MG tablet Take 1 tablet (81 mg total) by mouth daily.  . Calcium Citrate-Vitamin D (CITRACAL + D PO) Take 1 capsule by mouth daily.   . citalopram (CELEXA) 40 MG tablet Take 40 mg by mouth daily.   Marland Kitchen gabapentin (NEURONTIN) 600 MG tablet Take 600 mg by mouth 3 (three) times daily.   . Glucosamine-Chondroitin (GLUCOSAMINE CHONDR COMPLEX PO) Take 1 capsule by mouth daily.   Marland Kitchen lisinopril-hydrochlorothiazide (PRINZIDE,ZESTORETIC) 10-12.5 MG per tablet Take 1 tablet by mouth daily.   . meloxicam (MOBIC) 15 MG tablet Take 15 mg by mouth daily.   Marland Kitchen  metoprolol succinate (TOPROL-XL) 50 MG 24 hr tablet Take 50 mg by mouth daily.   Marland Kitchen MICRONIZED COLESTIPOL HCL 1 G tablet Take 1 g by mouth daily.   . Multiple Vitamins-Minerals (MULTIVITAMIN PO) Take by mouth.  Marland Kitchen NEXIUM 40 MG capsule Take 40 mg by mouth daily at 12 noon.   . Omega-3 Krill Oil 1000 MG CAPS Take 1 capsule by mouth daily.      Allergies:   Oxycodone and Penicillins   Social History   Socioeconomic History  . Marital status: Widowed    Spouse name: Not on file  . Number of children: Not on file  . Years of education: Not on file  . Highest education level: Not on file  Occupational History  . Not on file  Tobacco Use  . Smoking status: Never Smoker  . Smokeless  tobacco: Never Used  Substance and Sexual Activity  . Alcohol use: Yes    Comment: social  . Drug use: No  . Sexual activity: Not on file  Other Topics Concern  . Not on file  Social History Narrative  . Not on file   Social Determinants of Health   Financial Resource Strain:   . Difficulty of Paying Living Expenses: Not on file  Food Insecurity:   . Worried About Programme researcher, broadcasting/film/video in the Last Year: Not on file  . Ran Out of Food in the Last Year: Not on file  Transportation Needs:   . Lack of Transportation (Medical): Not on file  . Lack of Transportation (Non-Medical): Not on file  Physical Activity:   . Days of Exercise per Week: Not on file  . Minutes of Exercise per Session: Not on file  Stress:   . Feeling of Stress : Not on file  Social Connections:   . Frequency of Communication with Friends and Family: Not on file  . Frequency of Social Gatherings with Friends and Family: Not on file  . Attends Religious Services: Not on file  . Active Member of Clubs or Organizations: Not on file  . Attends Banker Meetings: Not on file  . Marital Status: Not on file     Family History: The patient's family history includes Diabetes in her father; Heart disease in her father and mother.  ROS:   Please see the history of present illness.    All other systems reviewed and are negative.  EKGs/Labs/Other Studies Reviewed:    The following studies were reviewed today: I discussed my findings with the patient at extensive length   Recent Labs: No results found for requested labs within last 8760 hours.  Recent Lipid Panel No results found for: CHOL, TRIG, HDL, CHOLHDL, VLDL, LDLCALC, LDLDIRECT  Physical Exam:    VS:  BP 118/68 (BP Location: Right Arm, Patient Position: Sitting, Cuff Size: Normal)   Pulse 68   Temp (!) 96 F (35.6 C)   Resp 18   Ht 5' (1.524 m)   Wt 169 lb 8 oz (76.9 kg)   SpO2 97%   BMI 33.10 kg/m     Wt Readings from Last 3  Encounters:  02/22/20 169 lb 8 oz (76.9 kg)  01/27/20 170 lb 3.2 oz (77.2 kg)  06/30/19 160 lb (72.6 kg)     GEN: Patient is in no acute distress HEENT: Normal NECK: No JVD; No carotid bruits LYMPHATICS: No lymphadenopathy CARDIAC: Hear sounds regular, 2/6 systolic murmur at the apex. RESPIRATORY:  Clear to auscultation without rales, wheezing or  rhonchi  ABDOMEN: Soft, non-tender, non-distended MUSCULOSKELETAL:  No edema; No deformity  SKIN: Warm and dry NEUROLOGIC:  Alert and oriented x 3 PSYCHIATRIC:  Normal affect   Signed, Jenean Lindau, MD  02/22/2020 3:37 PM    [facility]

## 2020-03-22 ENCOUNTER — Telehealth: Payer: Self-pay | Admitting: *Deleted

## 2020-03-22 ENCOUNTER — Other Ambulatory Visit: Payer: Self-pay | Admitting: Orthopedic Surgery

## 2020-03-22 NOTE — Telephone Encounter (Signed)
   [facility] Pre-operative Risk Assessment    Request for surgical clearance:  1. What type of surgery is being performed? LEFT TRAPEZIUM EXCISION, TENDON TRANSFER, LEFT SUSPENSION PLASTY, INJECTION CMC RIGHT THUMB    2. When is this surgery scheduled? 04/26/20   3. What type of clearance is required (medical clearance vs. Pharmacy clearance to hold med vs. Both)?  MEDICAL  4. Are there any medications that need to be held prior to surgery and how long? ASA   5. Practice name and name of physician performing surgery? [facility]; [place]. [place]   6. What is your office phone number [phone]    7.   What is your office fax number [phone] ATTN: Cyndee Brightly, RN  8.   Anesthesia type (None, local, MAC, general) ? AXILLARY BLOCK   Catherine Morgan 03/22/2020, 4:16 PM  _________________________________________________________________   (provider comments below)

## 2020-03-22 NOTE — Telephone Encounter (Signed)
LMTCB

## 2020-03-29 NOTE — Telephone Encounter (Signed)
Left message to call back and ask to speak with pre-op team. 

## 2020-04-03 NOTE — Telephone Encounter (Signed)
Follow up  Pt returning call, transferred call  

## 2020-04-03 NOTE — Telephone Encounter (Signed)
   Primary Cardiologist: Garwin Brothers, MD  Chart reviewed as part of pre-operative protocol coverage. Patient was contacted 04/03/2020 in reference to pre-operative risk assessment for pending surgery as outlined below.  Catherine Morgan was last seen on 02/22/20 by Dr. Tomie China.  Since that day, CORLISS COGGESHALL has done well. She had a reassuring stress test 01/2019. She can complete more than 4.0 METS without angina.   Therefore, based on ACC/AHA guidelines, the patient would be at acceptable risk for the planned procedure without further cardiovascular testing.   I will route this recommendation to the requesting party via Epic fax function and remove from pre-op pool.  Please call with questions.  Roe Rutherford Jakelin Taussig, PA 04/03/2020, 12:51 PM

## 2020-04-19 ENCOUNTER — Encounter (HOSPITAL_BASED_OUTPATIENT_CLINIC_OR_DEPARTMENT_OTHER): Payer: Self-pay | Admitting: Orthopedic Surgery

## 2020-04-19 ENCOUNTER — Other Ambulatory Visit: Payer: Self-pay

## 2020-04-23 ENCOUNTER — Encounter (HOSPITAL_BASED_OUTPATIENT_CLINIC_OR_DEPARTMENT_OTHER)
Admission: RE | Admit: 2020-04-23 | Discharge: 2020-04-23 | Disposition: A | Discharge status: Home / Self Care | Payer: Medicare Other | Source: Ambulatory Visit | Attending: Orthopedic Surgery | Admitting: Orthopedic Surgery

## 2020-04-23 ENCOUNTER — Other Ambulatory Visit (HOSPITAL_COMMUNITY)
Admission: RE | Admit: 2020-04-23 | Discharge: 2020-04-23 | Disposition: A | Discharge status: Home / Self Care | Payer: Medicare Other | Source: Ambulatory Visit | Attending: Orthopedic Surgery | Admitting: Orthopedic Surgery

## 2020-04-23 DIAGNOSIS — Z01812 Encounter for preprocedural laboratory examination: Secondary | ICD-10-CM | POA: Insufficient documentation

## 2020-04-23 DIAGNOSIS — Z20822 Contact with and (suspected) exposure to covid-19: Secondary | ICD-10-CM | POA: Insufficient documentation

## 2020-04-23 LAB — BASIC METABOLIC PANEL
Anion gap: 9 (ref 5–15)
BUN: 9 mg/dL (ref 8–23)
CO2: 27 mmol/L (ref 22–32)
Calcium: 9.2 mg/dL (ref 8.9–10.3)
Chloride: 106 mmol/L (ref 98–111)
Creatinine, Ser: 0.78 mg/dL (ref 0.44–1.00)
GFR calc Af Amer: >60 mL/min (ref >60)
GFR calc non Af Amer: >60 mL/min (ref >60)
Glucose, Bld: 145 mg/dL — ABNORMAL HIGH (ref 70–99)
Potassium: 3.5 mmol/L (ref 3.5–5.1)
Sodium: 142 mmol/L (ref 135–145)

## 2020-04-23 LAB — SARS CORONAVIRUS 2 (TAT 6-24 HRS): SARS Coronavirus 2: NEGATIVE

## 2020-04-23 NOTE — Progress Notes (Signed)

## 2020-04-26 ENCOUNTER — Encounter (HOSPITAL_BASED_OUTPATIENT_CLINIC_OR_DEPARTMENT_OTHER)
Admission: RE | Disposition: A | Discharge status: Home / Self Care | Payer: Self-pay | Source: Home / Self Care | Attending: Orthopedic Surgery

## 2020-04-26 ENCOUNTER — Encounter (HOSPITAL_BASED_OUTPATIENT_CLINIC_OR_DEPARTMENT_OTHER): Payer: Self-pay | Admitting: Orthopedic Surgery

## 2020-04-26 ENCOUNTER — Ambulatory Visit (HOSPITAL_BASED_OUTPATIENT_CLINIC_OR_DEPARTMENT_OTHER)
Admission: RE | Admit: 2020-04-26 | Discharge: 2020-04-26 | Disposition: A | Discharge status: Home / Self Care | Payer: Medicare Other | Attending: Orthopedic Surgery | Admitting: Orthopedic Surgery

## 2020-04-26 ENCOUNTER — Ambulatory Visit (HOSPITAL_BASED_OUTPATIENT_CLINIC_OR_DEPARTMENT_OTHER): Payer: Medicare Other | Admitting: Anesthesiology

## 2020-04-26 ENCOUNTER — Other Ambulatory Visit: Payer: Self-pay

## 2020-04-26 DIAGNOSIS — M189 Osteoarthritis of first carpometacarpal joint, unspecified: Secondary | ICD-10-CM | POA: Insufficient documentation

## 2020-04-26 DIAGNOSIS — I10 Essential (primary) hypertension: Secondary | ICD-10-CM | POA: Insufficient documentation

## 2020-04-26 DIAGNOSIS — G4733 Obstructive sleep apnea (adult) (pediatric): Secondary | ICD-10-CM | POA: Insufficient documentation

## 2020-04-26 HISTORY — PX: STERIOD INJECTION: SHX5046

## 2020-04-26 HISTORY — PX: CARPOMETACARPEL SUSPENSION PLASTY: SHX5005

## 2020-04-26 SURGERY — CARPOMETACARPEL (CMC) SUSPENSION PLASTY
Anesthesia: General | Site: Wrist | Laterality: Right

## 2020-04-26 MED ORDER — LACTATED RINGERS IV SOLN: INTRAVENOUS | Status: DC

## 2020-04-26 MED ORDER — VANCOMYCIN HCL IN DEXTROSE 1-5 GM/200ML-% IV SOLN
1000.0000 mg | INTRAVENOUS | Status: AC
  Administered 2020-04-26: 1000 mg via INTRAVENOUS

## 2020-04-26 MED ORDER — BUPIVACAINE-EPINEPHRINE (PF) 0.5% -1:200000 IJ SOLN
INTRAMUSCULAR | Status: DC | PRN
  Administered 2020-04-26: 25 mL via PERINEURAL

## 2020-04-26 MED ORDER — DEXAMETHASONE SODIUM PHOSPHATE 10 MG/ML IJ SOLN
INTRAMUSCULAR | Status: DC | PRN
  Administered 2020-04-26: 5 mg via INTRAVENOUS

## 2020-04-26 MED ORDER — DIPHENHYDRAMINE HCL 50 MG/ML IJ SOLN
INTRAMUSCULAR | Status: AC
  Filled 2020-04-26: qty 1

## 2020-04-26 MED ORDER — LIDOCAINE 2% (20 MG/ML) 5 ML SYRINGE
INTRAMUSCULAR | Status: AC
  Filled 2020-04-26: qty 5

## 2020-04-26 MED ORDER — LIDOCAINE HCL (PF) 1 % IJ SOLN
INTRAMUSCULAR | Status: DC | PRN
  Administered 2020-04-26: 1 mL

## 2020-04-26 MED ORDER — ONDANSETRON HCL 4 MG/2ML IJ SOLN: 4.0000 mg | Freq: Four times a day (QID) | INTRAMUSCULAR | Status: DC | PRN

## 2020-04-26 MED ORDER — EPHEDRINE SULFATE 50 MG/ML IJ SOLN
INTRAMUSCULAR | Status: DC | PRN
Start: 2020-04-26 — End: 2020-04-26
  Administered 2020-04-26 (×2): 10 mg via INTRAVENOUS

## 2020-04-26 MED ORDER — BETAMETHASONE SOD PHOS & ACET 6 (3-3) MG/ML IJ SUSP
INTRAMUSCULAR | Status: AC
  Filled 2020-04-26: qty 5

## 2020-04-26 MED ORDER — PROPOFOL 10 MG/ML IV BOLUS
INTRAVENOUS | Status: DC | PRN
  Administered 2020-04-26: 150 mg via INTRAVENOUS

## 2020-04-26 MED ORDER — PHENYLEPHRINE 40 MCG/ML (10ML) SYRINGE FOR IV PUSH (FOR BLOOD PRESSURE SUPPORT)
PREFILLED_SYRINGE | INTRAVENOUS | Status: AC
  Filled 2020-04-26: qty 10

## 2020-04-26 MED ORDER — MIDAZOLAM HCL 2 MG/2ML IJ SOLN
INTRAMUSCULAR | Status: AC
  Filled 2020-04-26: qty 2

## 2020-04-26 MED ORDER — BETAMETHASONE SOD PHOS & ACET 6 (3-3) MG/ML IJ SUSP
INTRAMUSCULAR | Status: DC | PRN
  Administered 2020-04-26: 1 mL via INTRA_ARTICULAR

## 2020-04-26 MED ORDER — EPHEDRINE 5 MG/ML INJ
INTRAVENOUS | Status: AC
  Filled 2020-04-26: qty 10

## 2020-04-26 MED ORDER — FENTANYL CITRATE (PF) 100 MCG/2ML IJ SOLN
50.0000 ug | INTRAMUSCULAR | Status: DC | PRN
  Administered 2020-04-26: 50 ug via INTRAVENOUS

## 2020-04-26 MED ORDER — DEXAMETHASONE SODIUM PHOSPHATE 10 MG/ML IJ SOLN
INTRAMUSCULAR | Status: AC
  Filled 2020-04-26: qty 1

## 2020-04-26 MED ORDER — FENTANYL CITRATE (PF) 100 MCG/2ML IJ SOLN: 25.0000 ug | INTRAMUSCULAR | Status: DC | PRN

## 2020-04-26 MED ORDER — MIDAZOLAM HCL 2 MG/2ML IJ SOLN
1.0000 mg | INTRAMUSCULAR | Status: DC | PRN
  Administered 2020-04-26: 1 mg via INTRAVENOUS

## 2020-04-26 MED ORDER — VANCOMYCIN HCL IN DEXTROSE 1-5 GM/200ML-% IV SOLN
INTRAVENOUS | Status: AC
  Filled 2020-04-26: qty 200

## 2020-04-26 MED ORDER — SUCCINYLCHOLINE CHLORIDE 200 MG/10ML IV SOSY
PREFILLED_SYRINGE | INTRAVENOUS | Status: AC
  Filled 2020-04-26: qty 10

## 2020-04-26 MED ORDER — HYDROCODONE-ACETAMINOPHEN 5-325 MG PO TABS: 1.0000 | ORAL_TABLET | Freq: Four times a day (QID) | ORAL | 0 refills | Status: AC | PRN

## 2020-04-26 MED ORDER — FENTANYL CITRATE (PF) 100 MCG/2ML IJ SOLN
INTRAMUSCULAR | Status: AC
  Filled 2020-04-26: qty 2

## 2020-04-26 MED ORDER — ONDANSETRON HCL 4 MG/2ML IJ SOLN
INTRAMUSCULAR | Status: AC
  Filled 2020-04-26: qty 2

## 2020-04-26 MED ORDER — LIDOCAINE HCL (PF) 1 % IJ SOLN
INTRAMUSCULAR | Status: AC
  Filled 2020-04-26: qty 5

## 2020-04-26 MED ORDER — PHENYLEPHRINE HCL-NACL 10-0.9 MG/250ML-% IV SOLN
INTRAVENOUS | Status: DC | PRN
  Administered 2020-04-26: 20 ug/min via INTRAVENOUS

## 2020-04-26 SURGICAL SUPPLY — 57 items
APL PRP STRL LF DISP 70% ISPRP (MISCELLANEOUS) ×2
BIT DRILL PASSING CMC 1/4 FLEX (BIT) IMPLANT
BLADE MINI RND TIP GREEN BEAV (BLADE) ×4 IMPLANT
BLADE SURG 15 STRL LF DISP TIS (BLADE) ×2 IMPLANT
BLADE SURG 15 STRL SS (BLADE) ×4
BNDG CMPR 9X4 STRL LF SNTH (GAUZE/BANDAGES/DRESSINGS) ×2
BNDG COHESIVE 3X5 TAN STRL LF (GAUZE/BANDAGES/DRESSINGS) ×4 IMPLANT
BNDG ESMARK 4X9 LF (GAUZE/BANDAGES/DRESSINGS) ×4 IMPLANT
BNDG GAUZE ELAST 4 BULKY (GAUZE/BANDAGES/DRESSINGS) ×4 IMPLANT
BUTTON ALL-SUT W/BACKSTOP (Orthopedic Implant) ×2 IMPLANT
CHLORAPREP W/TINT 26 (MISCELLANEOUS) ×4 IMPLANT
CORD BIPOLAR FORCEPS 12FT (ELECTRODE) ×4 IMPLANT
COVER BACK TABLE 60X90IN (DRAPES) ×4 IMPLANT
COVER MAYO STAND STRL (DRAPES) ×4 IMPLANT
COVER WAND RF STERILE (DRAPES) IMPLANT
CUFF TOURN SGL QUICK 18X4 (TOURNIQUET CUFF) ×2 IMPLANT
DECANTER SPIKE VIAL GLASS SM (MISCELLANEOUS) IMPLANT
DRAPE EXTREMITY T 121X128X90 (DISPOSABLE) ×4 IMPLANT
DRAPE OEC MINIVIEW 54X84 (DRAPES) ×4 IMPLANT
DRAPE SURG 17X23 STRL (DRAPES) ×4 IMPLANT
DRILL PASSING CMC 1/4 FLEX (BIT) ×4
GAUZE 4X4 16PLY RFD (DISPOSABLE) IMPLANT
GAUZE SPONGE 4X4 12PLY STRL (GAUZE/BANDAGES/DRESSINGS) ×4 IMPLANT
GAUZE XEROFORM 1X8 LF (GAUZE/BANDAGES/DRESSINGS) ×4 IMPLANT
GLOVE BIOGEL PI IND STRL 8.5 (GLOVE) ×2 IMPLANT
GLOVE BIOGEL PI INDICATOR 8.5 (GLOVE) ×4
GLOVE SURG ORTHO 8.0 STRL STRW (GLOVE) ×4 IMPLANT
GOWN STRL REUS W/ TWL LRG LVL3 (GOWN DISPOSABLE) ×2 IMPLANT
GOWN STRL REUS W/TWL LRG LVL3 (GOWN DISPOSABLE) ×8
GOWN STRL REUS W/TWL XL LVL3 (GOWN DISPOSABLE) ×6 IMPLANT
NDL PRECISIONGLIDE 27X1.5 (NEEDLE) IMPLANT
NEEDLE PRECISIONGLIDE 27X1.5 (NEEDLE) IMPLANT
NS IRRIG 1000ML POUR BTL (IV SOLUTION) ×4 IMPLANT
PAD CAST 3X4 CTTN HI CHSV (CAST SUPPLIES) ×2 IMPLANT
PADDING CAST ABS 3INX4YD NS (CAST SUPPLIES)
PADDING CAST ABS COTTON 3X4 (CAST SUPPLIES) IMPLANT
PADDING CAST COTTON 3X4 STRL (CAST SUPPLIES) ×4
SET BASIN DAY SURGERY F.S. (CUSTOM PROCEDURE TRAY) ×4 IMPLANT
SLEEVE SCD COMPRESS KNEE MED (MISCELLANEOUS) ×4 IMPLANT
SLING ARM FOAM STRAP MED (SOFTGOODS) ×2 IMPLANT
SPLINT PLASTER CAST XFAST 3X15 (CAST SUPPLIES) IMPLANT
SPLINT PLASTER XTRA FASTSET 3X (CAST SUPPLIES) ×20
STOCKINETTE 4X48 STRL (DRAPES) ×4 IMPLANT
SUT ETHIBOND 3-0 V-5 (SUTURE) ×4 IMPLANT
SUT ETHILON 4 0 PS 2 18 (SUTURE) ×4 IMPLANT
SUT FIBERWIRE 4-0 18 DIAM BLUE (SUTURE)
SUT MERSILENE 4 0 P 3 (SUTURE) IMPLANT
SUT STEEL 3 0 (SUTURE) ×2 IMPLANT
SUT VIC AB 4-0 P-3 18XBRD (SUTURE) IMPLANT
SUT VIC AB 4-0 P2 18 (SUTURE) IMPLANT
SUT VIC AB 4-0 P3 18 (SUTURE)
SUTURE FIBERWR 4-0 18 DIA BLUE (SUTURE) IMPLANT
SYR 5ML LUER SLIP (SYRINGE) ×2 IMPLANT
SYR BULB EAR ULCER 3OZ GRN STR (SYRINGE) ×4 IMPLANT
SYR CONTROL 10ML LL (SYRINGE) IMPLANT
TOWEL GREEN STERILE FF (TOWEL DISPOSABLE) ×8 IMPLANT
UNDERPAD 30X36 HEAVY ABSORB (UNDERPADS AND DIAPERS) ×4 IMPLANT

## 2020-04-26 NOTE — Discharge Instructions (Signed)
[facility] Center Instructions Hand Surgery  Wound Care: Keep your hand elevated above the level of your heart.  Do not allow it to dangle by your side.  Keep the dressing dry and do not remove it unless your doctor advises you to do so.  He will usually change it at the time of your post-op visit.  Moving your fingers is advised to stimulate circulation but will depend on the site of your surgery.  If you have a splint applied, your doctor will advise you regarding movement.  Activity: Do not drive or operate machinery today.  Rest today and then you may return to your normal activity and work as indicated by your physician.  Diet:  Drink liquids today or eat a light diet.  You may resume a regular diet tomorrow.       Safe Surgery and Sleep Apnea Sleep apnea is a condition in which breathing pauses or becomes shallow during sleep. Most people with the condition are not aware that they have it. It is important for your health care providers to know whether or not you have sleep apnea, especially if you are having surgery. Sleep apnea can increase your risk of complications during and after surgery. What is sleep apnea screening? Sleep apnea screening is a test to determine if you are at risk for sleep apnea. Before you have surgery, get screened for sleep apnea and talk with your surgeon and primary health care provider about your results. Screening usually involves answering a list of questions about your sleep quality. Ask your health care provider if you can be screened, or take a screening test yourself. You can find these tests online at the American Sleep Apnea Association website. Some questions you may be asked include:  Do you snore?  Is your sleep restless?  Do you have daytime sleepiness?  Has a partner or spouse told you that you stop breathing during sleep?  Have you had trouble concentrating or memory loss? Answer these questions honestly. If a screening test is positive, this  means you are at risk for the condition. Further testing may be needed to confirm a diagnosis of sleep apnea. Why does sleep apnea increase the risk for complications? Untreated sleep apnea increases the risk for certain complications during and after surgery. This is because when you have sleep apnea, your airways are more sensitive to medicines used during surgery. The airways can collapse and block the flow of air.  Having untreated sleep apnea can increase your risk for:  A longer stay in the recovery room or hospital.  Breathing difficulties such as low oxygen levels after surgery.  Increased pain after surgery.  Irregular heart rhythms.  Stroke.  Heart attack. You and your health care provider can take steps to help prevent these and other complications. What should I do if I have sleep apnea?  Before surgery  Tell your health care provider and anesthesia specialist that you have sleep apnea. Discuss your individual risks based on your screening results, the type of surgery you will be having, and other medical conditions that you have.  If you have a sleep apnea device (positive airway pressure device), wear it as prescribed. If you have not been wearing your device, talk with your health care provider about why you have not been wearing it. There are ways to improve your use of the device, such as: ? Adjusting the mask. ? Adding humidified air. ? Getting treatment for nasal congestion.  Do not use any  products that contain nicotine or tobacco, such as cigarettes and e-cigarettes. If you need help quitting, ask your health care provider. On the day of surgery  If instructed by your health care provider, bring your sleep apnea device with you.  Wear your sleep apnea device when you are sleeping during your hospital stay, or as told by your health care provider.  Ask your health care provider what special considerations will be taken during and after your surgery. After  surgery  You may need to be given extra oxygen and wear a continuous oxygen monitor (pulse oximetry).  For your safety, you may need to stay in the recovery room or hospital for longer than is normal.  Follow instructions from your health care provider about wearing your sleep device: ? Anytime you are sleeping, including during daytime naps. ? While taking prescription pain medicines, sleeping medicines, or medicines that make you drowsy.  If your health care provider approves, raise the head of your bed or lie on your side. Do not lie flat on your back.  Follow instructions from your health care provider about medicines: ? Avoid using sleep medicines unless they are prescribed by a health care provider who is aware of the results of your sleep apnea screening. ? Avoid using sleep medicines while taking opioid pain medicine. ? Limit your use of opioid pain medicines as much as possible. Ask your health care provider what is a safe amount to use. ? Ask about using pain medicines that do not affect your breathing, such as NSAIDs or acetaminophen. Where to find more information For more information about sleep apnea screening and healthy sleep, visit these websites:  Centers for Disease Control and Prevention: DetailSports.is  American Sleep Apnea Association: www.sleepapnea.org Contact a health care provider if:  You have sleep apnea or think you may be at risk for sleep apnea, and you are scheduled for surgery. Get help right away if:  You have trouble breathing.  You are very drowsy and cannot stay awake.  You are told that you have pauses in your breathing during sleep after surgery.  You have chest pain.  You have a fast heartbeat. Summary  It is important for your health care providers to know whether or not you have sleep apnea, especially if you are having surgery.  If you have sleep apnea, you are at an increased risk for complications during  surgery.  You and your health care provider can take precautions to help prevent complications. If you have sleep apnea, make sure to tell your health care provider and anesthesia specialist. This information is not intended to replace advice given to you by your health care provider. Make sure you discuss any questions you have with your health care provider. Document Revised: 03/25/2019 Document Reviewed: 03/19/2017 Elsevier Patient Education  2020 [employer].                                                    General expectations: Pain for two to three days. Fingers may become slightly swollen.  Call your doctor if any of the following occur: Severe pain not relieved by pain medication. Elevated temperature. Dressing soaked with blood. Inability to move fingers. White or bluish color to fingers.    Post Anesthesia Home Care Instructions  Activity: Get plenty of rest for the remainder of  the day. A responsible individual must stay with you for 24 hours following the procedure.  For the next 24 hours, DO NOT: -Drive a car -Advertising copywriter -Drink alcoholic beverages -Take any medication unless instructed by your physician -Make any legal decisions or sign important papers.  Meals: Start with liquid foods such as gelatin or soup. Progress to regular foods as tolerated. Avoid greasy, spicy, heavy foods. If nausea and/or vomiting occur, drink only clear liquids until the nausea and/or vomiting subsides. Call your physician if vomiting continues.  Special Instructions/Symptoms: Your throat may feel dry or sore from the anesthesia or the breathing tube placed in your throat during surgery. If this causes discomfort, gargle with warm salt water. The discomfort should disappear within 24 hours.  If you had a scopolamine patch placed behind your ear for the management of post- operative nausea and/or vomiting:  1. The medication in  the patch is effective for 72 hours, after which it should be removed.  Wrap patch in a tissue and discard in the trash. Wash hands thoroughly with soap and water. 2. You may remove the patch earlier than 72 hours if you experience unpleasant side effects which may include dry mouth, dizziness or visual disturbances. 3. Avoid touching the patch. Wash your hands with soap and water after contact with the patch.    Regional Anesthesia Blocks  1. Numbness or the inability to move the "blocked" extremity may last from 3-48 hours after placement. The length of time depends on the medication injected and your individual response to the medication. If the numbness is not going away after 48 hours, call your surgeon.  2. The extremity that is blocked will need to be protected until the numbness is gone and the  Strength has returned. Because you cannot feel it, you will need to take extra care to avoid injury. Because it may be weak, you may have difficulty moving it or using it. You may not know what position it is in without looking at it while the block is in effect.  3. For blocks in the legs and feet, returning to weight bearing and walking needs to be done carefully. You will need to wait until the numbness is entirely gone and the strength has returned. You should be able to move your leg and foot normally before you try and bear weight or walk. You will need someone to be with you when you first try to ensure you do not fall and possibly risk injury.  4. Bruising and tenderness at the needle site are common side effects and will resolve in a few days.  5. Persistent numbness or new problems with movement should be communicated to the surgeon or the [facility] [phone] [facility] [phone]).

## 2020-04-26 NOTE — Transfer of Care (Signed)
Immediate Anesthesia Transfer of Care Note  Patient: MERCADES BAJAJ  Procedure(s) Performed: TRAPEZIUM EXCISION TENDON TRANSFER, SUSPENSIONPLASY LEFT,INJECTION CARPOMETARCAPAL RIGHT THUMB (Right Wrist) STEROID INJECTION (Right Hand)  Patient Location: PACU  Anesthesia Type:GA combined with regional for post-op pain  Level of Consciousness: awake, alert  and drowsy  Airway & Oxygen Therapy: Patient Spontanous Breathing and Patient connected to face mask oxygen  Post-op Assessment: Report given to RN and Post -op Vital signs reviewed and stable  Post vital signs: Reviewed and stable  Last Vitals:  Vitals Value Taken Time  BP    Temp    Pulse 89 04/26/20 1101  Resp 13 04/26/20 1101  SpO2 95 % 04/26/20 1101  Vitals shown include unvalidated device data.  Last Pain:  Vitals:   04/26/20 0819  TempSrc: Oral  PainSc: 0-No pain         Complications: No apparent anesthesia complications

## 2020-04-26 NOTE — Op Note (Signed)
I assisted Surgeon(s) and Role:    * Cindee Salt, MD - Primary    * Betha Loa, MD on the Procedure(s): TRAPEZIUM EXCISION TENDON TRANSFER, SUSPENSIONPLASY LEFT,INJECTION CARPOMETARCAPAL RIGHT THUMB STEROID INJECTION on 04/26/2020.  I provided assistance on this case as follows: retraction soft tissues, placement of anchor, closure of wound.  Electronically signed by: Betha Loa, MD Date: 04/26/2020 Time: 10:53 AM

## 2020-04-26 NOTE — H&P (Signed)
  Catherine Morgan is an 69 y.o. female.   Chief Complaint: pain left thumb HPI: Catherine Morgan is a 69 yo female with bilateral CMC arthritis. She was last seen on 02/01/2020. She had an injection to the carpometacarpal joint of her left thumb at that time. She is complaining of both thumbs left greater than right. She has had CMC arthritis for multiple years greater than 10. She has not been treated conservatively with anti-inflammatories multiple injections. She continues complain of's complain of significant pain with any use on her left side. She has no history of diabetes thyroid problems or gout. She does have a history of arthritis. Family history is positive diabetes arthritis negative for the remainder.   Past Medical History:  Diagnosis Date  . Anxiety   . Depression   . Fibromyalgia   . GERD (gastroesophageal reflux disease)   . Hyperlipidemia   . Hypertension   . OSA (obstructive sleep apnea)    uses CPAP nightly    Past Surgical History:  Procedure Laterality Date  . ABDOMINAL HYSTERECTOMY    . CARPAL TUNNEL RELEASE    . FRACTURE SURGERY     lt elbow  . MASS EXCISION Right 06/26/2015   Procedure: EXCISION MASS RIGHT SMALL FINGER;  Surgeon: Cindee Salt, MD;  Location: [facility];  Service: Orthopedics;  Laterality: Right;  REGIONAL/FAB  . REDUCTION MAMMAPLASTY    . TRIGGER FINGER RELEASE      Family History  Problem Relation Age of Onset  . Heart disease Father   . Diabetes Father   . Heart disease Mother    Social History:  reports that she has never smoked. She has never used smokeless tobacco. She reports current alcohol use. She reports that she does not use drugs.  Allergies:  Allergies  Allergen Reactions  . Oxycodone Itching  . Penicillins Itching    No medications prior to admission.    No results found for this or any previous visit (from the past 48 hour(s)).  No results found.   Pertinent items are noted in HPI.  Height 4\' 11"  (1.499  m), weight 76.7 kg.  General appearance: alert, cooperative and appears stated age Head: Normocephalic, without obvious abnormality Neck: no JVD Resp: clear to auscultation bilaterally Cardio: regular rate and rhythm, S1, S2 normal, no murmur, click, rub or gallop GI: soft, non-tender; bowel sounds normal; no masses,  no organomegaly Extremities: pain base of left thumb Pulses: 2+ and symmetric Skin: Skin color, texture, turgor normal. No rashes or lesions Neurologic: Grossly normal Incision/Wound: na  Assessment/Plan Assessment:  1. Primary osteoarthritis of both first carpometacarpal joints  2. Bilateral thumb pain    Plan: We have discussed possibility of injection to her right thumb with suspension plasty on the left with tendon transfer micro link support and trapezial excision. Preperi-and postoperative course been discussed along with risk and complications. She is aware there is no guarantee to the surgery the possibility of infection recurrence injury to arteries nerves tendons incomplete relief symptoms dystrophy. We would also recommend an injection of the [facility] joint right thumb at that time. Would like to proceed this will be scheduled in outpatient under regional anesthesia.   [facility] 04/26/2020, 5:32 AM

## 2020-04-26 NOTE — Anesthesia Procedure Notes (Signed)
Procedure Name: LMA Insertion Date/Time: 04/26/2020 9:45 AM Performed by: Ronnette Hila, CRNA Pre-anesthesia Checklist: Patient identified, Emergency Drugs available, Suction available and Patient being monitored Patient Re-evaluated:Patient Re-evaluated prior to induction Oxygen Delivery Method: Circle system utilized Preoxygenation: Pre-oxygenation with 100% oxygen Induction Type: IV induction Ventilation: Mask ventilation without difficulty LMA: LMA inserted LMA Size: 4.0 Number of attempts: 1 Airway Equipment and Method: Bite block Placement Confirmation: positive ETCO2 Tube secured with: Tape Dental Injury: Teeth and Oropharynx as per pre-operative assessment

## 2020-04-26 NOTE — Brief Op Note (Signed)
04/26/2020  10:54 AM  PATIENT:  Catherine Morgan  69 y.o. female  PRE-OPERATIVE DIAGNOSIS:  BILATERAL METACARPAL ARTHRITIS  POST-OPERATIVE DIAGNOSIS:  BILATERAL METACARPAL ARTHRITIS  PROCEDURE:  Procedure(s) with comments: TRAPEZIUM EXCISION TENDON TRANSFER, SUSPENSIONPLASY LEFT,INJECTION CARPOMETARCAPAL RIGHT THUMB (Right) - AXILLARY STEROID INJECTION (Right)  SURGEON:  Surgeon(s) and Role:    * Cindee Salt, MD - Primary    * Betha Loa, MD  PHYSICIAN ASSISTANT:   ASSISTANTS: K Sava Proby,MD   ANESTHESIA:   regional and IV sedation  EBL:  5 mL   BLOOD ADMINISTERED:none  DRAINS: none   LOCAL MEDICATIONS USED:  NONE  SPECIMEN:  No Specimen  DISPOSITION OF SPECIMEN:  N/A  COUNTS:  YES  TOURNIQUET:   Total Tourniquet Time Documented: Upper Arm (Left) - 47 minutes Total: Upper Arm (Left) - 47 minutes   DICTATION: .Reubin Milan Dictation  PLAN OF CARE: Discharge to home after PACU  PATIENT DISPOSITION:  PACU - hemodynamically stable.

## 2020-04-26 NOTE — Anesthesia Procedure Notes (Signed)
Anesthesia Regional Block: Supraclavicular block   Pre-Anesthetic Checklist: ,, timeout performed, Correct Patient, Correct Site, Correct Laterality, Correct Procedure, Correct Position, site marked, Risks and benefits discussed,  Surgical consent,  Pre-op evaluation,  At surgeon's request and post-op pain management  Laterality: Left  Prep: chloraprep       Needles:  Injection technique: Single-shot  Needle Type: Echogenic Stimulator Needle     Needle Length: 5cm  Needle Gauge: 22     Additional Needles:   Procedures:, nerve stimulator,,,,,,,   Nerve Stimulator or Paresthesia:  Response: biceps flexion, 0.45 mA,   Additional Responses:   Narrative:  Start time: 04/26/2020 8:40 AM End time: 04/26/2020 8:50 AM Injection made incrementally with aspirations every 5 mL.  Performed by: Personally  Anesthesiologist: Achille Rich, MD  Additional Notes: Functioning IV was confirmed and monitors were applied.  A 61mm 22ga Arrow echogenic stimulator needle was used. Sterile prep and drape,hand hygiene and sterile gloves were used.  Negative aspiration and negative test dose prior to incremental administration of local anesthetic. The patient tolerated the procedure well.  Ultrasound guidance: relevent anatomy identified, needle position confirmed, local anesthetic spread visualized around nerve(s), vascular puncture avoided.  Image printed for medical record.

## 2020-04-26 NOTE — Progress Notes (Signed)
Assisted Dr. Hodierne with left, ultrasound guided, supraclavicular block. Side rails up, monitors on throughout procedure. See vital signs in flow sheet. Tolerated Procedure well. 

## 2020-04-26 NOTE — Op Note (Signed)
NAME: Catherine Morgan MEDICAL RECORD NO: [id number] DATE OF BIRTH: [birth year] FACILITY: [facility] LOCATION: [facility] PHYSICIAN: Nicki Reaper, MD   OPERATIVE REPORT   DATE OF PROCEDURE: 04/26/20    PREOPERATIVE DIAGNOSIS:   CMC arthritis bilateral thumb   POSTOPERATIVE DIAGNOSIS:   Same   PROCEDURE:   Trapezial excision with tendon transfer micro link suture left thumb and injection the [facility] joint right thumb   SURGEON: Cindee Salt, M.D.   ASSISTANT: Betha Loa, MD   ANESTHESIA:  Regional with sedation   INTRAVENOUS FLUIDS:  Per anesthesia flow sheet.   ESTIMATED BLOOD LOSS:  Minimal.   COMPLICATIONS:  None.   SPECIMENS:  none   TOURNIQUET TIME:    Total Tourniquet Time Documented: Upper Arm (Left) - 47 minutes Total: Upper Arm (Left) - 47 minutes    DISPOSITION:  Stable to PACU.   INDICATIONS: Patient is a 69 year old female with a long history of CMC arthritis not responsive to continue to conservative therapy.  She has elected undergo arthroplasty of her left thumb with an injection of her right thumb during anesthesia.  She is aware that there is no guarantee to the surgery the possibility of infection recurrence injury to arteries nerves tendons incomplete relief of symptoms dystrophy.  The preoperative area the extremities are marked by the surgeon and patient and and antibiotics given.  A supraclavicular block to the left arm was then undertaken by anesthesia.  OPERATIVE COURSE: Patient is brought to the operating room placed in a supine position with both arms free.  A timeout was taken confirming patient procedure and the injection given to the carpometacarpal joint of her right thumb with equal amounts of Celestone and Xylocaine.  Was done after a thorough prep.  The left arm was prepped and draped following a ChloraPrep prep.  A 3-minute dry time was allowed and a timeout taken confirming patient and procedure.  The limb was exsanguinated with an  Esmarch bandage tourniquet placed high in the arm was inflated to 250 mmHg.  A longitudinal incision was made along the line of the abductor pollicis longus tendon to its insertion on the base of the thumb metacarpal.  This carried down through subcutaneous tissue after identifying the radial nerve.  The dissection was carried between the EPL tendon and APL tendon.  Bleeders were electrocauterized with bipolar.  The radial artery was identified and protected at the base of the trapezium.  The trapezium was then isolated with sharp and dull dissection.  Significant arthritic changes with total loss of cartilage was present on both the base of the thumb metacarpal and the distal trapezium.  The trapezium was isolated and removed in 1 piece using a large rondure.  The flexor carpi radialis longus was identified and the base of the wound found to be intact.  The area of insertion of the abductor pollicis longus was freed to allow placement of a micro link suture and this was inserted through the base of the thumb metacarpal then through the base of the index metacarpal exiting on the dorsal ulnar aspect of the index metacarpal base.  This position was confirmed with image intensification.  The micro link suture was then passed through the base of the thumb and index after making an incision over the dorsum of the index finger and dissecting down to bone.  A right angled hemostat was then placed between the base of the thumb and index metacarpal and the micro link suture tied snugly.  The flexor carpi radialis tendon was then split after bringing this into the wound with a right angle retractor with the wrist flexed.  This allowed harvesting of one half of the flexor carpi radialis leaving it attached to the base of the index metacarpal.  This was then passed through the insertion of the abductor pollicis longus and sutured with 3-0 Ethibond sutures.  This was then doubled back over and sutured to itself at the base of  the insertion of the flexor carpi radialis on the index metacarpal.  This again was done with figure-of-eight Ethibond sutures.  The wound was copious irrigated with saline.  X-rays confirm positioning of the thumb well suspended.  With compression there was no proximal migration.  The wound was copious irrigated with saline again and the capsule closed with interrupted 4-0 Vicryl.  The skin was closed after closing the subcutaneous tissue with a Vicryl using 4-0 nylon sutures.  A sterile compressive dressing dorsal palmar thumb spica splint was applied.  On deflation the tourniquet all fingers immediately pink.  She was taken to the recovery room for observation in satisfactory condition.  She will be discharged home to return to the hand center of [facility] in 1 week on Tylenol ibuprofen for pain with Norco 04/16/2024's for breakthrough.  Daryll Brod, MD Electronically signed, 04/26/20

## 2020-04-26 NOTE — Anesthesia Preprocedure Evaluation (Signed)
Anesthesia Evaluation  Patient identified by MRN, date of birth, ID band Patient awake    Reviewed: Allergy & Precautions, H&P , NPO status , Patient's Chart, lab work & pertinent test results  Airway Mallampati: II   Neck ROM: full    Dental   Pulmonary sleep apnea ,    breath sounds clear to auscultation       Cardiovascular hypertension,  Rhythm:regular Rate:Normal     Neuro/Psych PSYCHIATRIC DISORDERS Anxiety Depression  Neuromuscular disease    GI/Hepatic GERD  ,  Endo/Other  obese  Renal/GU      Musculoskeletal  (+) Arthritis , Fibromyalgia -  Abdominal   Peds  Hematology   Anesthesia Other Findings   Reproductive/Obstetrics                             Anesthesia Physical Anesthesia Plan  ASA: II  Anesthesia Plan: General   Post-op Pain Management:  Regional for Post-op pain   Induction: Intravenous  PONV Risk Score and Plan: 3 and Ondansetron, Dexamethasone and Treatment may vary due to age or medical condition  Airway Management Planned: LMA  Additional Equipment:   Intra-op Plan:   Post-operative Plan:   Informed Consent: I have reviewed the patients History and Physical, chart, labs and discussed the procedure including the risks, benefits and alternatives for the proposed anesthesia with the patient or authorized representative who has indicated his/her understanding and acceptance.       Plan Discussed with: CRNA, Anesthesiologist and Surgeon  Anesthesia Plan Comments:         Anesthesia Quick Evaluation

## 2020-04-27 ENCOUNTER — Encounter: Payer: Self-pay | Admitting: *Deleted

## 2020-04-27 NOTE — Anesthesia Postprocedure Evaluation (Signed)
Anesthesia Post Note  Patient: [name]  Procedure(s) Performed: TRAPEZIUM EXCISION TENDON TRANSFER, SUSPENSIONPLASY LEFT,INJECTION CARPOMETARCAPAL RIGHT THUMB (Right Wrist) STEROID INJECTION (Right Hand)     Patient location during evaluation: PACU Anesthesia Type: General and Regional Level of consciousness: awake and alert Pain management: pain level controlled Vital Signs Assessment: post-procedure vital signs reviewed and stable Respiratory status: spontaneous breathing, nonlabored ventilation, respiratory function stable and patient connected to nasal cannula oxygen Cardiovascular status: blood pressure returned to baseline and stable Postop Assessment: no apparent nausea or vomiting Anesthetic complications: no    Last Vitals:  Vitals:   04/26/20 1217 04/26/20 1225  BP: 104/82 104/82  Pulse: 84 84  Resp: 18 18  Temp:  36.6 C  SpO2: 97% 97%    Last Pain:  Vitals:   04/26/20 1225  TempSrc: Oral  PainSc:                  Chandria Rookstool S
